# Patient Record
Sex: Female | Born: 1950
Health system: Southern US, Community
[De-identification: ages and names within clinical notes are randomized; demographics above are authoritative.]

## PROBLEM LIST (undated history)

## (undated) DIAGNOSIS — K759 Inflammatory liver disease, unspecified: Secondary | ICD-10-CM

## (undated) DIAGNOSIS — I10 Essential (primary) hypertension: Secondary | ICD-10-CM

## (undated) DIAGNOSIS — I2699 Other pulmonary embolism without acute cor pulmonale: Secondary | ICD-10-CM

## (undated) DIAGNOSIS — E7800 Pure hypercholesterolemia, unspecified: Secondary | ICD-10-CM

## (undated) DIAGNOSIS — M1990 Unspecified osteoarthritis, unspecified site: Secondary | ICD-10-CM

## (undated) HISTORY — PX: OTHER SURGICAL HISTORY: SHX169

## (undated) HISTORY — DX: Pure hypercholesterolemia, unspecified: E78.00

## (undated) HISTORY — DX: Essential (primary) hypertension: I10

## (undated) HISTORY — PX: TONSILLECTOMY: SUR1361

---

## 1991-07-15 HISTORY — PX: ABDOMINAL HYSTERECTOMY: SHX81

## 2006-04-15 ENCOUNTER — Ambulatory Visit: Payer: Self-pay | Admitting: Orthopedic Surgery

## 2009-08-21 ENCOUNTER — Encounter: Payer: Self-pay | Admitting: Cardiology

## 2009-08-22 ENCOUNTER — Encounter: Payer: Self-pay | Admitting: Cardiology

## 2009-08-23 ENCOUNTER — Encounter: Payer: Self-pay | Admitting: Cardiology

## 2009-08-28 ENCOUNTER — Encounter: Payer: Self-pay | Admitting: Cardiology

## 2009-09-17 ENCOUNTER — Encounter: Payer: Self-pay | Admitting: Cardiology

## 2009-09-18 ENCOUNTER — Encounter: Payer: Self-pay | Admitting: Cardiology

## 2009-10-08 DIAGNOSIS — I10 Essential (primary) hypertension: Secondary | ICD-10-CM | POA: Insufficient documentation

## 2009-10-08 DIAGNOSIS — R079 Chest pain, unspecified: Secondary | ICD-10-CM

## 2009-10-08 DIAGNOSIS — E7800 Pure hypercholesterolemia, unspecified: Secondary | ICD-10-CM

## 2009-10-08 DIAGNOSIS — R0602 Shortness of breath: Secondary | ICD-10-CM

## 2009-10-09 ENCOUNTER — Ambulatory Visit: Payer: Self-pay | Admitting: Cardiology

## 2009-10-09 DIAGNOSIS — E669 Obesity, unspecified: Secondary | ICD-10-CM | POA: Insufficient documentation

## 2009-10-09 DIAGNOSIS — R9439 Abnormal result of other cardiovascular function study: Secondary | ICD-10-CM | POA: Insufficient documentation

## 2009-12-17 ENCOUNTER — Ambulatory Visit (HOSPITAL_COMMUNITY): Admission: RE | Admit: 2009-12-17 | Discharge: 2009-12-17 | Payer: Self-pay | Admitting: Cardiology

## 2009-12-17 ENCOUNTER — Encounter: Payer: Self-pay | Admitting: Cardiology

## 2009-12-17 ENCOUNTER — Ambulatory Visit: Payer: Self-pay | Admitting: Internal Medicine

## 2009-12-28 ENCOUNTER — Telehealth: Payer: Self-pay | Admitting: Cardiology

## 2010-01-15 ENCOUNTER — Ambulatory Visit: Payer: Self-pay | Admitting: Cardiology

## 2010-08-13 NOTE — Medication Information (Signed)
Summary: MEDICATION LIST FROM [facility]  MEDICATION LIST FROM [facility]   Imported By: Claudette Laws 10/05/2009 08:16:03  _____________________________________________________________________  External Attachment:    Type:   Image     Comment:   External Document

## 2010-08-13 NOTE — Letter (Signed)
Summary: External Correspondence/ OFFICE VISIT [facility]  External Correspondence/ [facility]ICE VISIT [facility]   Imported By: Dorise Hiss 09/07/2009 08:12:11  _____________________________________________________________________  External Attachment:    Type:   Image     Comment:   External Document

## 2010-08-13 NOTE — Assessment & Plan Note (Signed)
Summary: DISCUSS RESULTS OF CPX PER DR. Wisam Siefring-JM   Visit Type:  Follow-up Primary Provider:  Dr. Kirstie Peri  CC:  Dyspnea and HTN.  History of Present Illness: The patient presents for followup of the above.  Since I last saw her she continues to have dyspnea with exertion. She had a cardiopulmonary stress test which demonstrated no significant circulatory or ventilatory limitation. However, she did have a hypertensive blood pressure response and some suggestion that diastolic dysfunction could be contributing to symptoms. She has had an echo with normal systolic function but some LVH. Since that time she has had no new symptoms. She is not describing resting shortness of breath, PND or orthopnea. She is not having any chest pressure, neck or arm discomfort. She is not having any palpitations, presyncope or syncope.  Preventive Screening-Counseling & Management  Alcohol-Tobacco     Smoking Status: never  Current Medications (verified): 1)  Simvastatin 20 Mg Tabs (Simvastatin) .... Take 1 Tablet By Mouth Once A Day 2)  Diovan Hct 160-25 Mg Tabs (Valsartan-Hydrochlorothiazide) .... Take 1 Tablet By Mouth Once A Day 3)  Aspir-Low 81 Mg Tbec (Aspirin) .... Take 1 Tablet By Mouth Once A Day 4)  Lunesta 3 Mg Tabs (Eszopiclone) .... Take 1 Tab By Mouth At Bedtime As Needed 5)  B-12 1500 Mcg Cr-Tabs (Cyanocobalamin) .... Take 1 Tablet By Mouth Once A Day  Allergies (verified): No Known Drug Allergies  Comments:  Nurse/Medical Assistant: The patient is currently on medications but does not know the name or dosage at this time. Instructed to contact our office with details. Will update medication list at that time.  Past History:  Past Medical History: Reviewed history from 10/09/2009 and no changes required. HYPERTENSION since 2000 PURE HYPERCHOLESTEROLEMIA since 2000 LONG-TERM USE OF ASPIRIN  SHORTNESS OF BREATH   Past Surgical History: Reviewed history from 10/09/2009 and no  changes required. Hysterectomy CHIN LIFT PLASTIC SURGERY 2005 DEXA SCAN 2004 SHOWED SCORE -0.6 Tonsilectomy  Review of Systems       As stated in the HPI and negative for all other systems.   Vital Signs:  Patient profile:   60 year old female Height:      66 inches Weight:      189 pounds Pulse rate:   78 / minute BP sitting:   135 / 72  (left arm) Cuff size:   large  Vitals Entered By: Carlye Grippe (January 15, 2010 10:55 AM)  Physical Exam  General:  Well developed, well nourished, in no acute distress. Head:  normocephalic and atraumatic Eyes:  PERRLA/EOM intact; conjunctiva and lids normal. Neck:  Neck supple, no JVD. No masses, thyromegaly or abnormal cervical nodes. Chest Wall:  no deformities or breast masses noted Lungs:  Clear bilaterally to auscultation and percussion.rales R base.  rales R base.   Abdomen:  Bowel sounds positive; abdomen soft and non-tender without masses, organomegaly, or hernias noted. No hepatosplenomegaly. Msk:  Back normal, normal gait. Muscle strength and tone normal. Extremities:  No clubbing or cyanosis. Neurologic:  Alert and oriented x 3. Skin:  Intact without lesions or rashes. Cervical Nodes:  no significant adenopathy Psych:  Normal affect.   Detailed Cardiovascular Exam  Neck    Carotids: Carotids full and equal bilaterally without bruits.      Neck Veins: Normal, no JVD.    Heart    Inspection: no deformities or lifts noted.      Palpation: normal PMI with no thrills palpable.  Auscultation: regular rate and rhythm, S1, S2 without murmurs, rubs, gallops, or clicks.    Vascular    Abdominal Aorta: no palpable masses, pulsations, or audible bruits.      Femoral Pulses: normal femoral pulses bilaterally.      Pedal Pulses: normal pedal pulses bilaterally.      Radial Pulses: normal radial pulses bilaterally.      Peripheral Circulation: no clubbing, cyanosis, or edema noted with normal capillary refill.      Impression & Recommendations:  Problem # 1:  SHORTNESS OF BREATH (ICD-786.05) It seems that her dyspnea is most likely related somewhat to deconditioning and weight but in addition to a hypertensive response with exercise and diastolic dysfunction. We have discussed this and a strategy of blood pressure control as described below.  Problem # 2:  HYPERTENSION (ICD-401.9) I am going to try for better blood pressure control with exercise which means a lower resting blood pressure. I will add amlodipine 2.5 mg daily to her regimen. 2 avoid drug interaction I will switch her from simvastatin to pravastatin.  Problem # 3:  OBESITY, UNSPECIFIED (ICD-278.00) We discussed at length a weight loss strategy to try to achieve blood pressure control and improvement in dyspnea.  Problem # 4:  PURE HYPERCHOLESTEROLEMIA (ICD-272.0) I will suggest a followup lipid profile in 8-10 weeks with the med change as above.  Patient Instructions: 1)  Your physician wants you to follow-up in: 4 months. You will receive a reminder letter in the mail one-two months in advance. If you don't receive a letter, please call our office to schedule the follow-up appointment.  2)  Stop Simvastatin. 3)  Start Pravastatin 40mg  by mouth at bedtime. 4)  Start Norvasc (amlodipine) 2.5mg  by mouth once daily. Prescriptions: AMLODIPINE BESYLATE 2.5 MG TABS (AMLODIPINE BESYLATE) Take one tablet by mouth daily  #30 x 6   Entered by:   Cyril Loosen, RN, BSN   Authorized by:   Rollene Rotunda, MD, [facility]   Signed by:   Cyril Loosen, RN, BSN on 01/15/2010   Method used:   Electronically to        [facility]* (retail)       [address]       [place], [place]  [address]       Ph: [phone]       Fax: [phone]   RxID:   [id number] PRAVASTATIN SODIUM 40 MG TABS (PRAVASTATIN SODIUM) Take one tablet by mouth daily at bedtime  #30 x 6   Entered by:   Cyril Loosen, RN, BSN   Authorized by:   Rollene Rotunda, MD, [facility]   Signed by:   Cyril Loosen, RN, BSN on 01/15/2010   Method used:   Electronically to        [facility]* (retail)       [address]       [place], [place]  [address]       Ph: [phone]       Fax: [phone]   RxID:   [id number]  I have reviewed and approved all prescriptions at the time of this visit. Rollene Rotunda, MD, [facility]  January 15, 2010 11:32 AM

## 2010-08-13 NOTE — Letter (Signed)
Summary: [facility]OFFICE [facility]  [facility]OFF[facility]   Imported By: Claudette Laws 10/05/2009 08:16:40  _____________________________________________________________________  External Attachment:    Type:   Image     Comment:   External Document

## 2010-08-13 NOTE — Progress Notes (Signed)
Summary: PHONE: CPX RESULTS AND SHORTNESS OF BREATH  Phone Note Call from Patient Call back at Home Phone [phone]   Caller: Patient Summary of Call: Continues to have shortness of breath . Patient would like to know her test results of CPX. please call [phone] (cell) Initial call taken by: Zachary George,  December 28, 2009 3:42 PM  Follow-up for Phone Call        Pt had CXP done on 6/6. Pt notified these results are not available yet. Pt also notified note will be sent to Dr. Antoine Poche for review. Follow-up by: Cyril Loosen, RN, BSN,  December 28, 2009 4:22 PM  Additional Follow-up for Phone Call Additional follow up Details #1::        Left message for Laymond Purser in CPX lab to call regarding results. Cyril Loosen, RN, BSN  December 31, 2009 11:00 AM     Additional Follow-up for Phone Call Additional follow up Details #2::    Patient is aware that this study result is not yet available. Follow-up by: Rollene Rotunda, MD, [facility],  December 31, 2009 3:12 PM

## 2010-08-13 NOTE — Letter (Signed)
Summary: [facility] D/C DR. Beatrix Fetters [facility]  [facility] D/C DR. Kirstie Peri   Imported By: Zachary George 10/08/2009 18:11:45  _____________________________________________________________________  External Attachment:    Type:   Image     Comment:   External Document

## 2010-08-13 NOTE — Assessment & Plan Note (Signed)
Summary: NP-ABN ECHO   Visit Type:  Initial Consult Primary Provider:  Dr. Kirstie Peri  CC:  DOE.  History of Present Illness: The patient presents for evaluation of shortness of breath. She has had no prior cardiac history very however, she has had shortness of breath which she thought started rather abruptly around Christmas. She noted that activities such as walking her granddaughter on level ground a short to moderate distance would cause shortness of breath. She found it hard to walk a half a block. She would be panting for breath and would need to stop and go home. She would recover in a few minutes. She did describe to Dr. Sherryll Burger some chest tightness and was actually hospitalized in February. Echocardiogram demonstrated an EF of 65% with some mild LVH and questionable diastolic dysfunction. Echocardiography suggested possibly some very mild apical ischemia versus attenuation. The patient denies any resting symptoms and has no PND or orthopnea. She does have some feeling that her heart is going fast when she exerts herself. She's not having any resting palpitations, presyncope or syncope. She doesn't have any neck or arm discomfort. She has no associated nausea vomiting or diaphoresis.  Preventive Screening-Counseling & Management  Alcohol-Tobacco     Smoking Status: never  Current Medications (verified): 1)  Simvastatin 20 Mg Tabs (Simvastatin) .... Take 1 Tablet By Mouth Once A Day 2)  Diovan Hct 160-25 Mg Tabs (Valsartan-Hydrochlorothiazide) .... Take 1 Tablet By Mouth Once A Day 3)  Aspir-Low 81 Mg Tbec (Aspirin) .... Take 1 Tablet By Mouth Once A Day 4)  Lunesta 3 Mg Tabs (Eszopiclone) .... Take 1 Tab By Mouth At Bedtime As Needed 5)  B-12 1500 Mcg Cr-Tabs (Cyanocobalamin) .... Take 1 Tablet By Mouth Once A Day  Allergies (verified): No Known Drug Allergies  Comments:  Nurse/Medical Assistant: The patient's medications were reviewed with the patient and were updated in the  Medication List. Pt brought a list of medications to office visit.  Cyril Loosen, RN, BSN (October 09, 2009 10:10 AM)  Past History:  Past Medical History: HYPERTENSION since 2000 PURE HYPERCHOLESTEROLEMIA since 2000 LONG-TERM USE OF ASPIRIN  SHORTNESS OF BREATH   Past Surgical History: Hysterectomy CHIN LIFT PLASTIC SURGERY 2005 DEXA SCAN 2004 SHOWED SCORE -0.6 Tonsilectomy  Family History: MOTHER HAD BREAST CANCER FATHER DIED AGE 16 HAD CAD (CABG late 42s) FAMILY HISTORY OF RHEUMATOID ARTHRITIS  Social History: Alcohol Use - no Drug Use - no Tobacco never Married, 2 childrenSmoking Status:  never  Review of Systems       Difficulty sleeping. Otherwise as stated in the history of present illness negative for other systems.  Vital Signs:  Patient profile:   60 year old female Height:      66 inches Weight:      186.50 pounds BMI:     30.21 Pulse rate:   73 / minute BP sitting:   141 / 78  (left arm) Cuff size:   regular  Vitals Entered By: Cyril Loosen, RN, BSN (October 09, 2009 10:05 AM)  Nutrition Counseling: Patient's BMI is greater than 25 and therefore counseled on weight management options. CC: DOE   Physical Exam  General:  Well developed, well nourished, in no acute distress. Head:  normocephalic and atraumatic Eyes:  PERRLA/EOM intact; conjunctiva and lids normal. Mouth:  Teeth, gums and palate normal. Oral mucosa normal. Neck:  Neck supple, no JVD. No masses, thyromegaly or abnormal cervical nodes. Chest Wall:  no deformities or breast masses noted  Lungs:  Clear bilaterally to auscultation and percussion. Abdomen:  Bowel sounds positive; abdomen soft and non-tender without masses, organomegaly, or hernias noted. No hepatosplenomegaly. Msk:  Back normal, normal gait. Muscle strength and tone normal. Extremities:  No clubbing or cyanosis. Neurologic:  Alert and oriented x 3. Skin:  Intact without lesions or rashes. Cervical Nodes:  no significant  adenopathy Axillary Nodes:  no significant adenopathy Inguinal Nodes:  no significant adenopathy Psych:  Normal affect.   Detailed Cardiovascular Exam  Neck    Carotids: Carotids full and equal bilaterally without bruits.      Neck Veins: Normal, no JVD.    Heart    Inspection: no deformities or lifts noted.      Palpation: normal PMI with no thrills palpable.      Auscultation: regular rate and rhythm, S1, S2 without murmurs, rubs, gallops, or clicks.    Vascular    Abdominal Aorta: no palpable masses, pulsations, or audible bruits.      Femoral Pulses: normal femoral pulses bilaterally.      Pedal Pulses: normal pedal pulses bilaterally.      Radial Pulses: normal radial pulses bilaterally.      Peripheral Circulation: no clubbing, cyanosis, or edema noted with normal capillary refill.     EKG  Procedure date:  08/22/2009  Findings:      Normal sinus rhythm, rate 93, early transition, axis within normal limits, intervals within normal limits, no acute ST-T wave changes.  Impression & Recommendations:  Problem # 1:  SHORTNESS OF BREATH (ICD-786.05)  The patient has dyspnea on exertion which is a relatively sudden onset. I do not think the stress perfusion study indicates obstructive coronary disease in that her physical and history is not consistent with that. She has mild risk factors. Rather this could be a pulmonary etiology. She may have some diastolic dysfunction but again I would think this is minor. I have discussed with her that in this situation I think cardiopulmonary stress testing is indicated. I will order this.  Her updated medication list for this problem includes:    Diovan Hct 160-25 Mg Tabs (Valsartan-hydrochlorothiazide) .Marland Kitchen... Take 1 tablet by mouth once a day    Aspir-low 81 Mg Tbec (Aspirin) .Marland Kitchen... Take 1 tablet by mouth once a day  Orders: CPX Test at [facility] (CPX Test)  Problem # 2:  HYPERTENSION (ICD-401.9)  Her blood pressure is  upper limits of normal. I would suggest weight loss as the management strategy for this.  Her updated medication list for this problem includes:    Diovan Hct 160-25 Mg Tabs (Valsartan-hydrochlorothiazide) .Marland Kitchen... Take 1 tablet by mouth once a day    Aspir-low 81 Mg Tbec (Aspirin) .Marland Kitchen... Take 1 tablet by mouth once a day  Problem # 3:  OBESITY, UNSPECIFIED (ICD-278.00) Her BMI puts her in the obese range. We talked about weight loss.  Problem # 4:  MYOCARDIAL PERFUSION SCAN, WITH STRESS TEST, ABNORMAL (ICD-794.39) As above I think this represents a low risk study with probable artifact. She needs continued primary risk reduction.  Patient Instructions: 1)  Your physician has recommended that you have a cardiopulmonary stress test (CPX).  CPX testing is a non-invasive measurement of heart and lung function. It replaces a traditional treadmill stress test. This type of test provides a tremendous amount of information that relates not only to your present condition but also for future outcomes.  This test combines measurements of your ventilation, respiratory gas exchange in the lungs,  electrocardiogram (EKG), blood pressure and physical response before, during, and following an exercise protocol. 2)  Your physician recommends that you continue on your current medications as directed. Please refer to the Current Medication list given to you today. 3)  Follow up pending.

## 2010-08-13 NOTE — Letter (Signed)
Summary: Internal Other/ PATIENT HISTORY FORM  Internal Other/ PATIENT HISTORY FORM   Imported By: Dorise Hiss 10/09/2009 11:31:50  _____________________________________________________________________  External Attachment:    Type:   Image     Comment:   External Document

## 2012-01-29 ENCOUNTER — Encounter: Payer: Self-pay | Admitting: *Deleted

## 2013-03-14 DIAGNOSIS — I2699 Other pulmonary embolism without acute cor pulmonale: Secondary | ICD-10-CM

## 2013-03-14 HISTORY — DX: Other pulmonary embolism without acute cor pulmonale: I26.99

## 2013-03-14 HISTORY — PX: JOINT REPLACEMENT: SHX530

## 2015-08-08 DIAGNOSIS — Z85828 Personal history of other malignant neoplasm of skin: Secondary | ICD-10-CM | POA: Diagnosis not present

## 2015-08-08 DIAGNOSIS — C44519 Basal cell carcinoma of skin of other part of trunk: Secondary | ICD-10-CM | POA: Diagnosis not present

## 2015-08-08 DIAGNOSIS — L905 Scar conditions and fibrosis of skin: Secondary | ICD-10-CM | POA: Diagnosis not present

## 2015-08-08 DIAGNOSIS — D485 Neoplasm of uncertain behavior of skin: Secondary | ICD-10-CM | POA: Diagnosis not present

## 2015-08-08 DIAGNOSIS — L570 Actinic keratosis: Secondary | ICD-10-CM | POA: Diagnosis not present

## 2015-08-23 DIAGNOSIS — L089 Local infection of the skin and subcutaneous tissue, unspecified: Secondary | ICD-10-CM | POA: Diagnosis not present

## 2015-08-23 DIAGNOSIS — C44519 Basal cell carcinoma of skin of other part of trunk: Secondary | ICD-10-CM | POA: Diagnosis not present

## 2015-08-27 DIAGNOSIS — I10 Essential (primary) hypertension: Secondary | ICD-10-CM | POA: Diagnosis not present

## 2015-08-27 DIAGNOSIS — I2699 Other pulmonary embolism without acute cor pulmonale: Secondary | ICD-10-CM | POA: Diagnosis not present

## 2015-08-27 DIAGNOSIS — Z789 Other specified health status: Secondary | ICD-10-CM | POA: Diagnosis not present

## 2015-08-27 DIAGNOSIS — G4700 Insomnia, unspecified: Secondary | ICD-10-CM | POA: Diagnosis not present

## 2015-08-27 DIAGNOSIS — E785 Hyperlipidemia, unspecified: Secondary | ICD-10-CM | POA: Diagnosis not present

## 2015-08-28 DIAGNOSIS — Z23 Encounter for immunization: Secondary | ICD-10-CM | POA: Diagnosis not present

## 2015-09-06 DIAGNOSIS — Z4802 Encounter for removal of sutures: Secondary | ICD-10-CM | POA: Diagnosis not present

## 2015-11-29 DIAGNOSIS — M2550 Pain in unspecified joint: Secondary | ICD-10-CM | POA: Diagnosis not present

## 2015-11-29 DIAGNOSIS — Z6830 Body mass index (BMI) 30.0-30.9, adult: Secondary | ICD-10-CM | POA: Diagnosis not present

## 2015-11-29 DIAGNOSIS — I2699 Other pulmonary embolism without acute cor pulmonale: Secondary | ICD-10-CM | POA: Diagnosis not present

## 2015-11-29 DIAGNOSIS — I10 Essential (primary) hypertension: Secondary | ICD-10-CM | POA: Diagnosis not present

## 2016-02-28 DIAGNOSIS — I2699 Other pulmonary embolism without acute cor pulmonale: Secondary | ICD-10-CM | POA: Diagnosis not present

## 2016-02-28 DIAGNOSIS — M549 Dorsalgia, unspecified: Secondary | ICD-10-CM | POA: Diagnosis not present

## 2016-02-28 DIAGNOSIS — I10 Essential (primary) hypertension: Secondary | ICD-10-CM | POA: Diagnosis not present

## 2016-05-23 DIAGNOSIS — G4700 Insomnia, unspecified: Secondary | ICD-10-CM | POA: Diagnosis not present

## 2016-05-23 DIAGNOSIS — E785 Hyperlipidemia, unspecified: Secondary | ICD-10-CM | POA: Diagnosis not present

## 2016-05-23 DIAGNOSIS — Z299 Encounter for prophylactic measures, unspecified: Secondary | ICD-10-CM | POA: Diagnosis not present

## 2016-05-23 DIAGNOSIS — Z6830 Body mass index (BMI) 30.0-30.9, adult: Secondary | ICD-10-CM | POA: Diagnosis not present

## 2016-05-23 DIAGNOSIS — I10 Essential (primary) hypertension: Secondary | ICD-10-CM | POA: Diagnosis not present

## 2016-05-26 DIAGNOSIS — M25551 Pain in right hip: Secondary | ICD-10-CM | POA: Diagnosis not present

## 2016-05-26 DIAGNOSIS — Z96641 Presence of right artificial hip joint: Secondary | ICD-10-CM | POA: Diagnosis not present

## 2016-05-27 DIAGNOSIS — Z1231 Encounter for screening mammogram for malignant neoplasm of breast: Secondary | ICD-10-CM | POA: Diagnosis not present

## 2016-05-29 DIAGNOSIS — Z96641 Presence of right artificial hip joint: Secondary | ICD-10-CM | POA: Diagnosis not present

## 2016-05-29 DIAGNOSIS — M25551 Pain in right hip: Secondary | ICD-10-CM | POA: Diagnosis not present

## 2016-05-29 DIAGNOSIS — M1612 Unilateral primary osteoarthritis, left hip: Secondary | ICD-10-CM | POA: Diagnosis not present

## 2016-08-01 DIAGNOSIS — Z96641 Presence of right artificial hip joint: Secondary | ICD-10-CM | POA: Diagnosis not present

## 2016-08-01 DIAGNOSIS — Z471 Aftercare following joint replacement surgery: Secondary | ICD-10-CM | POA: Diagnosis not present

## 2016-08-07 DIAGNOSIS — Z96641 Presence of right artificial hip joint: Secondary | ICD-10-CM | POA: Diagnosis not present

## 2016-08-14 DIAGNOSIS — Z471 Aftercare following joint replacement surgery: Secondary | ICD-10-CM | POA: Diagnosis not present

## 2016-08-14 DIAGNOSIS — M25551 Pain in right hip: Secondary | ICD-10-CM | POA: Diagnosis not present

## 2016-08-14 DIAGNOSIS — Z96641 Presence of right artificial hip joint: Secondary | ICD-10-CM | POA: Diagnosis not present

## 2016-08-23 DIAGNOSIS — Z23 Encounter for immunization: Secondary | ICD-10-CM | POA: Diagnosis not present

## 2016-08-25 DIAGNOSIS — Z789 Other specified health status: Secondary | ICD-10-CM | POA: Diagnosis not present

## 2016-08-25 DIAGNOSIS — E785 Hyperlipidemia, unspecified: Secondary | ICD-10-CM | POA: Diagnosis not present

## 2016-08-25 DIAGNOSIS — G4700 Insomnia, unspecified: Secondary | ICD-10-CM | POA: Diagnosis not present

## 2016-08-25 DIAGNOSIS — Z299 Encounter for prophylactic measures, unspecified: Secondary | ICD-10-CM | POA: Diagnosis not present

## 2016-08-25 DIAGNOSIS — I2699 Other pulmonary embolism without acute cor pulmonale: Secondary | ICD-10-CM | POA: Diagnosis not present

## 2016-08-25 DIAGNOSIS — M2550 Pain in unspecified joint: Secondary | ICD-10-CM | POA: Diagnosis not present

## 2016-08-25 DIAGNOSIS — I10 Essential (primary) hypertension: Secondary | ICD-10-CM | POA: Diagnosis not present

## 2016-08-25 DIAGNOSIS — Z6831 Body mass index (BMI) 31.0-31.9, adult: Secondary | ICD-10-CM | POA: Diagnosis not present

## 2016-08-27 DIAGNOSIS — M9903 Segmental and somatic dysfunction of lumbar region: Secondary | ICD-10-CM | POA: Diagnosis not present

## 2016-08-27 DIAGNOSIS — M47816 Spondylosis without myelopathy or radiculopathy, lumbar region: Secondary | ICD-10-CM | POA: Diagnosis not present

## 2016-08-27 DIAGNOSIS — S338XXA Sprain of other parts of lumbar spine and pelvis, initial encounter: Secondary | ICD-10-CM | POA: Diagnosis not present

## 2016-08-28 DIAGNOSIS — E785 Hyperlipidemia, unspecified: Secondary | ICD-10-CM | POA: Diagnosis not present

## 2016-08-29 DIAGNOSIS — M47816 Spondylosis without myelopathy or radiculopathy, lumbar region: Secondary | ICD-10-CM | POA: Diagnosis not present

## 2016-08-29 DIAGNOSIS — M9903 Segmental and somatic dysfunction of lumbar region: Secondary | ICD-10-CM | POA: Diagnosis not present

## 2016-08-29 DIAGNOSIS — S338XXA Sprain of other parts of lumbar spine and pelvis, initial encounter: Secondary | ICD-10-CM | POA: Diagnosis not present

## 2016-09-01 DIAGNOSIS — M47816 Spondylosis without myelopathy or radiculopathy, lumbar region: Secondary | ICD-10-CM | POA: Diagnosis not present

## 2016-09-01 DIAGNOSIS — M9903 Segmental and somatic dysfunction of lumbar region: Secondary | ICD-10-CM | POA: Diagnosis not present

## 2016-09-01 DIAGNOSIS — S338XXA Sprain of other parts of lumbar spine and pelvis, initial encounter: Secondary | ICD-10-CM | POA: Diagnosis not present

## 2016-09-03 DIAGNOSIS — S338XXA Sprain of other parts of lumbar spine and pelvis, initial encounter: Secondary | ICD-10-CM | POA: Diagnosis not present

## 2016-09-03 DIAGNOSIS — M47816 Spondylosis without myelopathy or radiculopathy, lumbar region: Secondary | ICD-10-CM | POA: Diagnosis not present

## 2016-09-03 DIAGNOSIS — M9903 Segmental and somatic dysfunction of lumbar region: Secondary | ICD-10-CM | POA: Diagnosis not present

## 2016-09-05 DIAGNOSIS — M9903 Segmental and somatic dysfunction of lumbar region: Secondary | ICD-10-CM | POA: Diagnosis not present

## 2016-09-05 DIAGNOSIS — M47816 Spondylosis without myelopathy or radiculopathy, lumbar region: Secondary | ICD-10-CM | POA: Diagnosis not present

## 2016-09-05 DIAGNOSIS — S338XXA Sprain of other parts of lumbar spine and pelvis, initial encounter: Secondary | ICD-10-CM | POA: Diagnosis not present

## 2016-09-15 DIAGNOSIS — M9903 Segmental and somatic dysfunction of lumbar region: Secondary | ICD-10-CM | POA: Diagnosis not present

## 2016-09-15 DIAGNOSIS — M47816 Spondylosis without myelopathy or radiculopathy, lumbar region: Secondary | ICD-10-CM | POA: Diagnosis not present

## 2016-09-15 DIAGNOSIS — S338XXA Sprain of other parts of lumbar spine and pelvis, initial encounter: Secondary | ICD-10-CM | POA: Diagnosis not present

## 2016-09-19 DIAGNOSIS — M9903 Segmental and somatic dysfunction of lumbar region: Secondary | ICD-10-CM | POA: Diagnosis not present

## 2016-09-19 DIAGNOSIS — S338XXA Sprain of other parts of lumbar spine and pelvis, initial encounter: Secondary | ICD-10-CM | POA: Diagnosis not present

## 2016-09-19 DIAGNOSIS — M47816 Spondylosis without myelopathy or radiculopathy, lumbar region: Secondary | ICD-10-CM | POA: Diagnosis not present

## 2016-11-18 DIAGNOSIS — I10 Essential (primary) hypertension: Secondary | ICD-10-CM | POA: Diagnosis not present

## 2016-11-18 DIAGNOSIS — Z6829 Body mass index (BMI) 29.0-29.9, adult: Secondary | ICD-10-CM | POA: Diagnosis not present

## 2016-11-18 DIAGNOSIS — Z299 Encounter for prophylactic measures, unspecified: Secondary | ICD-10-CM | POA: Diagnosis not present

## 2016-11-18 DIAGNOSIS — G4700 Insomnia, unspecified: Secondary | ICD-10-CM | POA: Diagnosis not present

## 2016-11-18 DIAGNOSIS — Z713 Dietary counseling and surveillance: Secondary | ICD-10-CM | POA: Diagnosis not present

## 2016-11-26 DIAGNOSIS — Z85828 Personal history of other malignant neoplasm of skin: Secondary | ICD-10-CM | POA: Diagnosis not present

## 2016-11-26 DIAGNOSIS — L570 Actinic keratosis: Secondary | ICD-10-CM | POA: Diagnosis not present

## 2016-11-26 DIAGNOSIS — C44519 Basal cell carcinoma of skin of other part of trunk: Secondary | ICD-10-CM | POA: Diagnosis not present

## 2016-11-26 DIAGNOSIS — D485 Neoplasm of uncertain behavior of skin: Secondary | ICD-10-CM | POA: Diagnosis not present

## 2016-12-03 DIAGNOSIS — H2513 Age-related nuclear cataract, bilateral: Secondary | ICD-10-CM | POA: Diagnosis not present

## 2016-12-03 DIAGNOSIS — H5203 Hypermetropia, bilateral: Secondary | ICD-10-CM | POA: Diagnosis not present

## 2016-12-03 DIAGNOSIS — H524 Presbyopia: Secondary | ICD-10-CM | POA: Diagnosis not present

## 2016-12-03 DIAGNOSIS — H52223 Regular astigmatism, bilateral: Secondary | ICD-10-CM | POA: Diagnosis not present

## 2016-12-18 DIAGNOSIS — L988 Other specified disorders of the skin and subcutaneous tissue: Secondary | ICD-10-CM | POA: Diagnosis not present

## 2016-12-18 DIAGNOSIS — C44519 Basal cell carcinoma of skin of other part of trunk: Secondary | ICD-10-CM | POA: Diagnosis not present

## 2017-01-01 DIAGNOSIS — Z4802 Encounter for removal of sutures: Secondary | ICD-10-CM | POA: Diagnosis not present

## 2017-01-28 DIAGNOSIS — G4700 Insomnia, unspecified: Secondary | ICD-10-CM | POA: Diagnosis not present

## 2017-01-28 DIAGNOSIS — I10 Essential (primary) hypertension: Secondary | ICD-10-CM | POA: Diagnosis not present

## 2017-01-28 DIAGNOSIS — H109 Unspecified conjunctivitis: Secondary | ICD-10-CM | POA: Diagnosis not present

## 2017-01-28 DIAGNOSIS — Z299 Encounter for prophylactic measures, unspecified: Secondary | ICD-10-CM | POA: Diagnosis not present

## 2017-01-28 DIAGNOSIS — Z6828 Body mass index (BMI) 28.0-28.9, adult: Secondary | ICD-10-CM | POA: Diagnosis not present

## 2017-05-18 DIAGNOSIS — Z6829 Body mass index (BMI) 29.0-29.9, adult: Secondary | ICD-10-CM | POA: Diagnosis not present

## 2017-05-18 DIAGNOSIS — Z299 Encounter for prophylactic measures, unspecified: Secondary | ICD-10-CM | POA: Diagnosis not present

## 2017-05-18 DIAGNOSIS — M25552 Pain in left hip: Secondary | ICD-10-CM | POA: Diagnosis not present

## 2017-05-18 DIAGNOSIS — G4700 Insomnia, unspecified: Secondary | ICD-10-CM | POA: Diagnosis not present

## 2017-05-18 DIAGNOSIS — I10 Essential (primary) hypertension: Secondary | ICD-10-CM | POA: Diagnosis not present

## 2017-06-02 DIAGNOSIS — M7062 Trochanteric bursitis, left hip: Secondary | ICD-10-CM | POA: Diagnosis not present

## 2017-06-02 DIAGNOSIS — M1612 Unilateral primary osteoarthritis, left hip: Secondary | ICD-10-CM | POA: Diagnosis not present

## 2017-06-02 DIAGNOSIS — M25552 Pain in left hip: Secondary | ICD-10-CM | POA: Diagnosis not present

## 2017-06-02 DIAGNOSIS — Z96641 Presence of right artificial hip joint: Secondary | ICD-10-CM | POA: Diagnosis not present

## 2017-07-14 DIAGNOSIS — Z87442 Personal history of urinary calculi: Secondary | ICD-10-CM

## 2017-07-14 HISTORY — DX: Personal history of urinary calculi: Z87.442

## 2017-07-15 DIAGNOSIS — M25552 Pain in left hip: Secondary | ICD-10-CM | POA: Diagnosis not present

## 2017-07-15 DIAGNOSIS — M7062 Trochanteric bursitis, left hip: Secondary | ICD-10-CM | POA: Diagnosis not present

## 2017-07-15 DIAGNOSIS — Z96641 Presence of right artificial hip joint: Secondary | ICD-10-CM | POA: Diagnosis not present

## 2017-07-15 DIAGNOSIS — M1612 Unilateral primary osteoarthritis, left hip: Secondary | ICD-10-CM | POA: Diagnosis not present

## 2017-07-15 DIAGNOSIS — M5410 Radiculopathy, site unspecified: Secondary | ICD-10-CM | POA: Diagnosis not present

## 2017-07-16 DIAGNOSIS — M1612 Unilateral primary osteoarthritis, left hip: Secondary | ICD-10-CM | POA: Diagnosis not present

## 2017-07-17 DIAGNOSIS — M1612 Unilateral primary osteoarthritis, left hip: Secondary | ICD-10-CM | POA: Diagnosis not present

## 2017-07-17 DIAGNOSIS — M25552 Pain in left hip: Secondary | ICD-10-CM | POA: Diagnosis not present

## 2017-08-13 DIAGNOSIS — M25552 Pain in left hip: Secondary | ICD-10-CM | POA: Diagnosis not present

## 2017-08-13 DIAGNOSIS — Z96641 Presence of right artificial hip joint: Secondary | ICD-10-CM | POA: Diagnosis not present

## 2017-08-13 DIAGNOSIS — M7062 Trochanteric bursitis, left hip: Secondary | ICD-10-CM | POA: Diagnosis not present

## 2017-08-13 DIAGNOSIS — M1612 Unilateral primary osteoarthritis, left hip: Secondary | ICD-10-CM | POA: Diagnosis not present

## 2017-08-18 DIAGNOSIS — I10 Essential (primary) hypertension: Secondary | ICD-10-CM | POA: Diagnosis not present

## 2017-08-18 DIAGNOSIS — Z299 Encounter for prophylactic measures, unspecified: Secondary | ICD-10-CM | POA: Diagnosis not present

## 2017-08-18 DIAGNOSIS — G4700 Insomnia, unspecified: Secondary | ICD-10-CM | POA: Diagnosis not present

## 2017-08-18 DIAGNOSIS — E785 Hyperlipidemia, unspecified: Secondary | ICD-10-CM | POA: Diagnosis not present

## 2017-08-18 DIAGNOSIS — I2699 Other pulmonary embolism without acute cor pulmonale: Secondary | ICD-10-CM | POA: Diagnosis not present

## 2017-08-18 DIAGNOSIS — Z6830 Body mass index (BMI) 30.0-30.9, adult: Secondary | ICD-10-CM | POA: Diagnosis not present

## 2017-09-03 DIAGNOSIS — H16223 Keratoconjunctivitis sicca, not specified as Sjogren's, bilateral: Secondary | ICD-10-CM | POA: Diagnosis not present

## 2017-09-03 DIAGNOSIS — H04123 Dry eye syndrome of bilateral lacrimal glands: Secondary | ICD-10-CM | POA: Diagnosis not present

## 2017-09-03 DIAGNOSIS — H04203 Unspecified epiphora, bilateral lacrimal glands: Secondary | ICD-10-CM | POA: Diagnosis not present

## 2017-09-17 DIAGNOSIS — M1611 Unilateral primary osteoarthritis, right hip: Secondary | ICD-10-CM | POA: Diagnosis not present

## 2017-09-17 DIAGNOSIS — Z96641 Presence of right artificial hip joint: Secondary | ICD-10-CM | POA: Diagnosis not present

## 2017-09-17 DIAGNOSIS — M160 Bilateral primary osteoarthritis of hip: Secondary | ICD-10-CM | POA: Diagnosis not present

## 2017-09-17 DIAGNOSIS — M1612 Unilateral primary osteoarthritis, left hip: Secondary | ICD-10-CM | POA: Diagnosis not present

## 2017-09-24 DIAGNOSIS — M47816 Spondylosis without myelopathy or radiculopathy, lumbar region: Secondary | ICD-10-CM | POA: Diagnosis not present

## 2017-09-24 DIAGNOSIS — S338XXA Sprain of other parts of lumbar spine and pelvis, initial encounter: Secondary | ICD-10-CM | POA: Diagnosis not present

## 2017-09-24 DIAGNOSIS — M9903 Segmental and somatic dysfunction of lumbar region: Secondary | ICD-10-CM | POA: Diagnosis not present

## 2017-09-25 DIAGNOSIS — H04203 Unspecified epiphora, bilateral lacrimal glands: Secondary | ICD-10-CM | POA: Diagnosis not present

## 2017-09-25 DIAGNOSIS — M9903 Segmental and somatic dysfunction of lumbar region: Secondary | ICD-10-CM | POA: Diagnosis not present

## 2017-09-25 DIAGNOSIS — M47816 Spondylosis without myelopathy or radiculopathy, lumbar region: Secondary | ICD-10-CM | POA: Diagnosis not present

## 2017-09-25 DIAGNOSIS — S338XXA Sprain of other parts of lumbar spine and pelvis, initial encounter: Secondary | ICD-10-CM | POA: Diagnosis not present

## 2017-09-25 DIAGNOSIS — H04123 Dry eye syndrome of bilateral lacrimal glands: Secondary | ICD-10-CM | POA: Diagnosis not present

## 2017-09-25 DIAGNOSIS — H16223 Keratoconjunctivitis sicca, not specified as Sjogren's, bilateral: Secondary | ICD-10-CM | POA: Diagnosis not present

## 2017-09-28 DIAGNOSIS — S338XXA Sprain of other parts of lumbar spine and pelvis, initial encounter: Secondary | ICD-10-CM | POA: Diagnosis not present

## 2017-09-28 DIAGNOSIS — M47816 Spondylosis without myelopathy or radiculopathy, lumbar region: Secondary | ICD-10-CM | POA: Diagnosis not present

## 2017-09-28 DIAGNOSIS — M9903 Segmental and somatic dysfunction of lumbar region: Secondary | ICD-10-CM | POA: Diagnosis not present

## 2017-09-30 DIAGNOSIS — J069 Acute upper respiratory infection, unspecified: Secondary | ICD-10-CM | POA: Diagnosis not present

## 2017-09-30 DIAGNOSIS — Z789 Other specified health status: Secondary | ICD-10-CM | POA: Diagnosis not present

## 2017-09-30 DIAGNOSIS — Z6830 Body mass index (BMI) 30.0-30.9, adult: Secondary | ICD-10-CM | POA: Diagnosis not present

## 2017-09-30 DIAGNOSIS — Z299 Encounter for prophylactic measures, unspecified: Secondary | ICD-10-CM | POA: Diagnosis not present

## 2017-09-30 DIAGNOSIS — I10 Essential (primary) hypertension: Secondary | ICD-10-CM | POA: Diagnosis not present

## 2017-09-30 DIAGNOSIS — Z2821 Immunization not carried out because of patient refusal: Secondary | ICD-10-CM | POA: Diagnosis not present

## 2017-10-05 DIAGNOSIS — M47816 Spondylosis without myelopathy or radiculopathy, lumbar region: Secondary | ICD-10-CM | POA: Diagnosis not present

## 2017-10-05 DIAGNOSIS — S338XXA Sprain of other parts of lumbar spine and pelvis, initial encounter: Secondary | ICD-10-CM | POA: Diagnosis not present

## 2017-10-05 DIAGNOSIS — M9903 Segmental and somatic dysfunction of lumbar region: Secondary | ICD-10-CM | POA: Diagnosis not present

## 2017-10-12 DIAGNOSIS — M47816 Spondylosis without myelopathy or radiculopathy, lumbar region: Secondary | ICD-10-CM | POA: Diagnosis not present

## 2017-10-12 DIAGNOSIS — M9903 Segmental and somatic dysfunction of lumbar region: Secondary | ICD-10-CM | POA: Diagnosis not present

## 2017-10-12 DIAGNOSIS — S338XXA Sprain of other parts of lumbar spine and pelvis, initial encounter: Secondary | ICD-10-CM | POA: Diagnosis not present

## 2017-10-15 DIAGNOSIS — M9903 Segmental and somatic dysfunction of lumbar region: Secondary | ICD-10-CM | POA: Diagnosis not present

## 2017-10-15 DIAGNOSIS — S338XXA Sprain of other parts of lumbar spine and pelvis, initial encounter: Secondary | ICD-10-CM | POA: Diagnosis not present

## 2017-10-15 DIAGNOSIS — M47816 Spondylosis without myelopathy or radiculopathy, lumbar region: Secondary | ICD-10-CM | POA: Diagnosis not present

## 2017-11-04 NOTE — Progress Notes (Signed)
Need orders in epic for 5-15 surgery

## 2017-11-12 DIAGNOSIS — M1710 Unilateral primary osteoarthritis, unspecified knee: Secondary | ICD-10-CM | POA: Diagnosis not present

## 2017-11-12 DIAGNOSIS — Z6830 Body mass index (BMI) 30.0-30.9, adult: Secondary | ICD-10-CM | POA: Diagnosis not present

## 2017-11-12 DIAGNOSIS — I10 Essential (primary) hypertension: Secondary | ICD-10-CM | POA: Diagnosis not present

## 2017-11-12 DIAGNOSIS — Z299 Encounter for prophylactic measures, unspecified: Secondary | ICD-10-CM | POA: Diagnosis not present

## 2017-11-12 DIAGNOSIS — Z713 Dietary counseling and surveillance: Secondary | ICD-10-CM | POA: Diagnosis not present

## 2017-11-19 ENCOUNTER — Other Ambulatory Visit (HOSPITAL_COMMUNITY): Payer: Self-pay | Admitting: *Deleted

## 2017-11-19 NOTE — Patient Instructions (Addendum)
Christina Clay  11/19/2017   Your procedure is scheduled on: 11-25-17  Report to [facility] Main  Entrance  Report to admitting at 1000 AM    Call this number if you have problems the morning of surgery [phone]   Remember: Do not eat food or drink liquids :After Midnight.     Take these medicines the morning of surgery with A SIP OF WATER: PRAVASTATIN (PRAVACHOL), AMLODIPINE (NORVASC)               You may not have any metal on your body including hair pins and              piercings  Do not wear jewelry, make-up, lotions, powders or perfumes, deodorant             Do not wear nail polish.  Do not shave  48 hours prior to surgery.                 Do not bring valuables to the hospital. [place].  Contacts, dentures or bridgework may not be worn into surgery.  Leave suitcase in the car. After surgery it may be brought to your room.                   Please read over the following fact sheets you were given: _____________________________________________________________________             [facility] - Preparing for Surgery Before surgery, you can play an important role.  Because skin is not sterile, your skin needs to be as free of germs as possible.  You can reduce the number of germs on your skin by washing with CHG (chlorahexidine gluconate) soap before surgery.  CHG is an antiseptic cleaner which kills germs and bonds with the skin to continue killing germs even after washing. Please DO NOT use if you have an allergy to CHG or antibacterial soaps.  If your skin becomes reddened/irritated stop using the CHG and inform your nurse when you arrive at Short Stay. Do not shave (including legs and underarms) for at least 48 hours prior to the first CHG shower.  You may shave your face/neck. Please follow these instructions carefully:  1.  Shower with CHG Soap the night before surgery and the  morning of  Surgery.  2.  If you choose to wash your hair, wash your hair first as usual with your  normal  shampoo.  3.  After you shampoo, rinse your hair and body thoroughly to remove the  shampoo.                           4.  Use CHG as you would any other liquid soap.  You can apply chg directly  to the skin and wash                       Gently with a scrungie or clean washcloth.  5.  Apply the CHG Soap to your body ONLY FROM THE NECK DOWN.   Do not use on face/ open                           Wound  or open sores. Avoid contact with eyes, ears mouth and genitals (private parts).                       Wash face,  Genitals (private parts) with your normal soap.             6.  Wash thoroughly, paying special attention to the area where your surgery  will be performed.  7.  Thoroughly rinse your body with warm water from the neck down.  8.  DO NOT shower/wash with your normal soap after using and rinsing off  the CHG Soap.                9.  Pat yourself dry with a clean towel.            10.  Wear clean pajamas.            11.  Place clean sheets on your bed the night of your first shower and do not  sleep with pets. Day of Surgery : Do not apply any lotions/deodorants the morning of surgery.  Please wear clean clothes to the hospital/surgery center.  FAILURE TO FOLLOW THESE INSTRUCTIONS MAY RESULT IN THE CANCELLATION OF YOUR SURGERY PATIENT SIGNATURE_________________________________  NURSE SIGNATURE__________________________________  ________________________________________________________________________   Adam Phenix  An incentive spirometer is a tool that can help keep your lungs clear and active. This tool measures how well you are filling your lungs with each breath. Taking long deep breaths may help reverse or decrease the chance of developing breathing (pulmonary) problems (especially infection) following:  A long period of time when you are unable to move or be active. BEFORE  THE PROCEDURE   If the spirometer includes an indicator to show your best effort, your nurse or respiratory therapist will set it to a desired goal.  If possible, sit up straight or lean slightly forward. Try not to slouch.  Hold the incentive spirometer in an upright position. INSTRUCTIONS FOR USE  1. Sit on the edge of your bed if possible, or sit up as far as you can in bed or on a chair. 2. Hold the incentive spirometer in an upright position. 3. Breathe out normally. 4. Place the mouthpiece in your mouth and seal your lips tightly around it. 5. Breathe in slowly and as deeply as possible, raising the piston or the ball toward the top of the column. 6. Hold your breath for 3-5 seconds or for as long as possible. Allow the piston or ball to fall to the bottom of the column. 7. Remove the mouthpiece from your mouth and breathe out normally. 8. Rest for a few seconds and repeat Steps 1 through 7 at least 10 times every 1-2 hours when you are awake. Take your time and take a few normal breaths between deep breaths. 9. The spirometer may include an indicator to show your best effort. Use the indicator as a goal to work toward during each repetition. 10. After each set of 10 deep breaths, practice coughing to be sure your lungs are clear. If you have an incision (the cut made at the time of surgery), support your incision when coughing by placing a pillow or rolled up towels firmly against it. Once you are able to get out of bed, walk around indoors and cough well. You may stop using the incentive spirometer when instructed by your caregiver.  RISKS AND COMPLICATIONS  Take your time so you do not get dizzy  or light-headed.  If you are in pain, you may need to take or ask for pain medication before doing incentive spirometry. It is harder to take a deep breath if you are having pain. AFTER USE  Rest and breathe slowly and easily.  It can be helpful to keep track of a log of your progress.  Your caregiver can provide you with a simple table to help with this. If you are using the spirometer at home, follow these instructions: [place] IF:   You are having difficultly using the spirometer.  You have trouble using the spirometer as often as instructed.  Your pain medication is not giving enough relief while using the spirometer.  You develop fever of 100.5 F (38.1 C) or higher. SEEK IMMEDIATE MEDICAL CARE IF:   You cough up bloody sputum that had not been present before.  You develop fever of 102 F (38.9 C) or greater.  You develop worsening pain at or near the incision site. MAKE SURE YOU:   Understand these instructions.  Will watch your condition.  Will get help right away if you are not doing well or get worse. Document Released: 11/10/2006 Document Revised: 09/22/2011 Document Reviewed: 01/11/2007 ExitCare Patient Information 2014 Exit[employer], [place].   ________________________________________________________________________  WHAT IS A BLOOD TRANSFUSION? Blood Transfusion Information  A transfusion is the replacement of blood or some of its parts. Blood is made up of multiple cells which provide different functions.  Red blood cells carry oxygen and are used for blood loss replacement.  White blood cells fight against infection.  Platelets control bleeding.  Plasma helps clot blood.  Other blood products are available for specialized needs, such as hemophilia or other clotting disorders. BEFORE THE TRANSFUSION  Who gives blood for transfusions?   Healthy volunteers who are fully evaluated to make sure their blood is safe. This is blood bank blood. Transfusion therapy is the safest it has ever been in the practice of medicine. Before blood is taken from a donor, a complete history is taken to make sure that person has no history of diseases nor engages in risky social behavior (examples are intravenous drug use or sexual activity with multiple  partners). The donor's travel history is screened to minimize risk of transmitting infections, such as malaria. The donated blood is tested for signs of infectious diseases, such as HIV and hepatitis. The blood is then tested to be sure it is compatible with you in order to minimize the chance of a transfusion reaction. If you or a relative donates blood, this is often done in anticipation of surgery and is not appropriate for emergency situations. It takes many days to process the donated blood. RISKS AND COMPLICATIONS Although transfusion therapy is very safe and saves many lives, the main dangers of transfusion include:   Getting an infectious disease.  Developing a transfusion reaction. This is an allergic reaction to something in the blood you were given. Every precaution is taken to prevent this. The decision to have a blood transfusion has been considered carefully by your caregiver before blood is given. Blood is not given unless the benefits outweigh the risks. AFTER THE TRANSFUSION  Right after receiving a blood transfusion, you will usually feel much better and more energetic. This is especially true if your red blood cells have gotten low (anemic). The transfusion raises the level of the red blood cells which carry oxygen, and this usually causes an energy increase.  The nurse administering the transfusion will monitor  you carefully for complications. HOME CARE INSTRUCTIONS  No special instructions are needed after a transfusion. You may find your energy is better. Speak with your caregiver about any limitations on activity for underlying diseases you may have. SEEK MEDICAL CARE IF:   Your condition is not improving after your transfusion.  You develop redness or irritation at the intravenous (IV) site. SEEK IMMEDIATE MEDICAL CARE IF:  Any of the following symptoms occur over the next 12 hours:  Shaking chills.  You have a temperature by mouth above 102 F (38.9 C), not  controlled by medicine.  Chest, back, or muscle pain.  People around you feel you are not acting correctly or are confused.  Shortness of breath or difficulty breathing.  Dizziness and fainting.  You get a rash or develop hives.  You have a decrease in urine output.  Your urine turns a dark color or changes to pink, red, or brown. Any of the following symptoms occur over the next 10 days:  You have a temperature by mouth above 102 F (38.9 C), not controlled by medicine.  Shortness of breath.  Weakness after normal activity.  The white part of the eye turns yellow (jaundice).  You have a decrease in the amount of urine or are urinating less often.  Your urine turns a dark color or changes to pink, red, or brown. Document Released: 06/27/2000 Document Revised: 09/22/2011 Document Reviewed: 02/14/2008 [facility] Patient Information 2014 [place], [place].  _______________________________________________________________________

## 2017-11-20 ENCOUNTER — Encounter (HOSPITAL_COMMUNITY): Payer: Self-pay | Admitting: *Deleted

## 2017-11-20 ENCOUNTER — Encounter (HOSPITAL_COMMUNITY)
Admission: RE | Admit: 2017-11-20 | Discharge: 2017-11-20 | Disposition: A | Discharge status: Home / Self Care | Payer: Medicare Other | Source: Ambulatory Visit | Attending: Orthopedic Surgery | Admitting: Orthopedic Surgery

## 2017-11-20 ENCOUNTER — Other Ambulatory Visit: Payer: Self-pay

## 2017-11-20 DIAGNOSIS — Z7901 Long term (current) use of anticoagulants: Secondary | ICD-10-CM | POA: Insufficient documentation

## 2017-11-20 DIAGNOSIS — Z7982 Long term (current) use of aspirin: Secondary | ICD-10-CM | POA: Insufficient documentation

## 2017-11-20 DIAGNOSIS — Z01818 Encounter for other preprocedural examination: Secondary | ICD-10-CM | POA: Insufficient documentation

## 2017-11-20 DIAGNOSIS — M1612 Unilateral primary osteoarthritis, left hip: Secondary | ICD-10-CM | POA: Insufficient documentation

## 2017-11-20 DIAGNOSIS — E7800 Pure hypercholesterolemia, unspecified: Secondary | ICD-10-CM | POA: Insufficient documentation

## 2017-11-20 DIAGNOSIS — Z79899 Other long term (current) drug therapy: Secondary | ICD-10-CM | POA: Diagnosis not present

## 2017-11-20 DIAGNOSIS — I10 Essential (primary) hypertension: Secondary | ICD-10-CM | POA: Insufficient documentation

## 2017-11-20 HISTORY — DX: Inflammatory liver disease, unspecified: K75.9

## 2017-11-20 HISTORY — DX: Other pulmonary embolism without acute cor pulmonale: I26.99

## 2017-11-20 HISTORY — DX: Unspecified osteoarthritis, unspecified site: M19.90

## 2017-11-20 LAB — COMPREHENSIVE METABOLIC PANEL
ALT: 12 U/L — ABNORMAL LOW (ref 14–54)
AST: 19 U/L (ref 15–41)
Albumin: 4 g/dL (ref 3.5–5.0)
Alkaline Phosphatase: 52 U/L (ref 38–126)
Anion gap: 9 (ref 5–15)
BUN: 17 mg/dL (ref 6–20)
CO2: 24 mmol/L (ref 22–32)
Calcium: 9 mg/dL (ref 8.9–10.3)
Chloride: 107 mmol/L (ref 101–111)
Creatinine, Ser: 0.68 mg/dL (ref 0.44–1.00)
GFR calc Af Amer: >60 mL/min (ref >60)
GFR calc non Af Amer: >60 mL/min (ref >60)
Glucose, Bld: 99 mg/dL (ref 65–99)
Potassium: 3.8 mmol/L (ref 3.5–5.1)
Sodium: 140 mmol/L (ref 135–145)
Total Bilirubin: 0.4 mg/dL (ref 0.3–1.2)
Total Protein: 6.7 g/dL (ref 6.5–8.1)

## 2017-11-20 LAB — CBC
HCT: 41.4 % (ref 36.0–46.0)
Hemoglobin: 13.4 g/dL (ref 12.0–15.0)
MCH: 28.8 pg (ref 26.0–34.0)
MCHC: 32.4 g/dL (ref 30.0–36.0)
MCV: 89 fL (ref 78.0–100.0)
Platelets: 276 10*3/uL (ref 150–400)
RBC: 4.65 MIL/uL (ref 3.87–5.11)
RDW: 12.8 % (ref 11.5–15.5)
WBC: 6.4 10*3/uL (ref 4.0–10.5)

## 2017-11-20 LAB — PROTIME-INR
INR: 0.94
Prothrombin Time: 12.4 seconds (ref 11.4–15.2)

## 2017-11-20 LAB — URINALYSIS, ROUTINE W REFLEX MICROSCOPIC
Bacteria, UA: NONE SEEN
Bilirubin Urine: NEGATIVE
Glucose, UA: NEGATIVE mg/dL
Hgb urine dipstick: NEGATIVE
Ketones, ur: 5 mg/dL — AB
Nitrite: NEGATIVE
Protein, ur: NEGATIVE mg/dL
Specific Gravity, Urine: 1.017 (ref 1.005–1.030)
pH: 5 (ref 5.0–8.0)

## 2017-11-20 LAB — SURGICAL PCR SCREEN
MRSA, PCR: NEGATIVE
STAPHYLOCOCCUS AUREUS: NEGATIVE

## 2017-11-20 LAB — APTT: aPTT: 24 seconds (ref 24–36)

## 2017-11-20 LAB — ABO/RH: ABO/RH(D): A POS

## 2017-11-20 NOTE — Progress Notes (Signed)
UA RESULTS FAXED TO DR Wynelle Link BY EP[employer]

## 2017-11-22 NOTE — H&P (Signed)
TOTAL HIP ADMISSION H&P  Patient is admitted for left total hip arthroplasty.  Subjective:  Chief Complaint: left hip pain  HPI: Christina Clay, 67 y.o. female, has a history of pain and functional disability in the left hip(s) due to arthritis and patient has failed non-surgical conservative treatments for greater than 12 weeks to include NSAID's and/or analgesics, corticosteriod injections, flexibility and strengthening excercises and activity modification.  Onset of symptoms was gradual starting 1 year ago with rapidlly worsening course since that time.The patient noted no past surgery on the left hip(s).  Patient currently rates pain in the left hip at 8 out of 10 with activity. Patient has night pain, worsening of pain with activity and weight bearing, pain that interfers with activities of daily living and pain with passive range of motion. Patient has evidence of subchondral cysts, periarticular osteophytes and joint space narrowing by imaging studies. This condition presents safety issues increasing the risk of falls. There is no current active infection.  Patient Active Problem List   Diagnosis Date Noted  . OBESITY, UNSPECIFIED 10/09/2009  . MYOCARDIAL PERFUSION SCAN, WITH STRESS TEST, ABNORMAL 10/09/2009  . PURE HYPERCHOLESTEROLEMIA 10/08/2009  . HYPERTENSION 10/08/2009  . SHORTNESS OF BREATH 10/08/2009  . CHEST PAIN UNSPECIFIED 10/08/2009   Past Medical History:  Diagnosis Date  . Arthritis    OA  . DJD (degenerative joint disease)    LOWER BACK  . Hepatitis   . HTN (hypertension)   . Hypercholesterolemia   . Pulmonary embolism ([place]) 03/2013    2 DAYS AFTER AFTER RIGHT HIP REPLACEMENT SURGERY TOOK XARELTO FOR A FEW MONTHS SURGERY WAS CAUSE    Past Surgical History:  Procedure Laterality Date  . ABDOMINAL HYSTERECTOMY  1993   COMPLETE  . chin lift  6-7- YRS AGO  . JOINT REPLACEMENT  03/2013   RIGHT THA POSTERIOR  . TONSILLECTOMY  AS CHILD       Current  Outpatient Medications  Medication Sig Dispense Refill Last Dose  . amLODipine (NORVASC) 2.5 MG tablet Take 2.5 mg by mouth daily.     . celecoxib (CELEBREX) 200 MG capsule Take 200 mg by mouth daily.     . Eszopiclone 3 MG TABS Take 3 mg by mouth at bedtime. Take immediately before bedtime      . ibuprofen (ADVIL,MOTRIN) 800 MG tablet Take 800 mg by mouth every 8 (eight) hours as needed (for pain.).   11/18/2017 at 800  . losartan (COZAAR) 25 MG tablet Take 25 mg by mouth daily.     . pravastatin (PRAVACHOL) 40 MG tablet Take 20 mg by mouth daily.     Marland Kitchen amoxicillin (AMOXIL) 500 MG capsule Take 2,000 mg by mouth See admin instructions. Take 4 capsules (2000 mg) by mouth 1 hour prior to dental appointments.  99   . aspirin 81 MG tablet Take 81 mg by mouth daily.   11/15/2017 at 800   Allergies  Allergen Reactions  . Naproxen Sodium Hives, Shortness Of Breath, Itching and Rash    Social History   Tobacco Use  . Smoking status: Never Smoker  . Smokeless tobacco: Never Used  Substance Use Topics  . Alcohol use: No    Family History  Problem Relation Age of Onset  . Cancer Mother   . Coronary artery disease Unknown      Review of Systems  Constitutional: Negative.   HENT: Negative.   Eyes: Negative.   Respiratory: Negative.   Cardiovascular: Negative.   Gastrointestinal: Negative.  Genitourinary: Negative.   Musculoskeletal: Positive for joint pain and myalgias. Negative for back pain, falls and neck pain.  Skin: Negative.   Neurological: Negative.   Endo/Heme/Allergies: Negative.   Psychiatric/Behavioral: Negative.     Objective:  Physical Exam  Constitutional: She is oriented to person, place, and time. She appears well-developed. No distress.  Obese  HENT:  Head: Normocephalic and atraumatic.  Right Ear: External ear normal.  Left Ear: External ear normal.  Nose: Nose normal.  Mouth/Throat: Oropharynx is clear and moist.  Eyes: Conjunctivae and EOM are normal.   Neck: Normal range of motion. Neck supple.  Cardiovascular: Normal rate, regular rhythm, normal heart sounds and intact distal pulses.  No murmur heard. Respiratory: Effort normal and breath sounds normal. No respiratory distress. She has no wheezes.  GI: Soft. Bowel sounds are normal. She exhibits no distension. There is no tenderness.  Musculoskeletal:       Right knee: Normal.       Left knee: Normal.  Right Hip Exam: ROM: Flexion to 120 degrees, Rotation 20 degrees and abduction 30 degrees without discomfort.  There is no tenderness over the greater trochanter.  There is no pain on provocative testing of the hip.   Left Hip Exam: ROM: Flexion to 90 degrees, no internal rotation, external rotation to 10 degrees, 10 degrees abduction without discomfort.  No internal rotation, external rotation to 10 degrees.  Tenderness over the greater trochanter.   Neurological: She is alert and oriented to person, place, and time.  Skin: No rash noted. She is not diaphoretic. No erythema.  Psychiatric: She has a normal mood and affect. Her behavior is normal.    Vitals Ht: 5 ft 4 in  Wt: 186 lbs  BMI: 31.9  BP: 158/90  Pulse: 92 bpm    Imaging Review Plain radiographs demonstrate severe degenerative joint disease of the left hip(s). The bone quality appears to be good for age and reported activity level.    Preoperative templating of the joint replacement has been completed, documented, and submitted to the Operating Room personnel in order to optimize intra-operative equipment management.    Assessment/Plan:  End stage primary osteoarthritis, left hip(s)  The patient history, physical examination, clinical judgement of the provider and imaging studies are consistent with end stage degenerative joint disease of the left hip(s) and total hip arthroplasty is deemed medically necessary. The treatment options including medical management, injection therapy, arthroscopy and arthroplasty  were discussed at length. The risks and benefits of total hip arthroplasty were presented and reviewed. The risks due to aseptic loosening, infection, stiffness, dislocation/subluxation,  thromboembolic complications and other imponderables were discussed.  The patient acknowledged the explanation, agreed to proceed with the plan and consent was signed. Patient is being admitted for inpatient treatment for surgery, pain control, PT, OT, prophylactic antibiotics, VTE prophylaxis, progressive ambulation and ADL's and discharge planning.The patient is planning to be discharged home with HEP.    Therapy Plans: HEP Disposition: Home with husband Planned DVT prophylaxis: Xarelto 10mg  daily DME needed: none PCP: Dr. Manuella Ghazi Other: topical TXA; hx of PE    Ardeen Jourdain, PA-C

## 2017-11-24 MED ORDER — TRANEXAMIC ACID 1000 MG/10ML IV SOLN
2000.0000 mg | INTRAVENOUS | Status: AC
  Filled 2017-11-24: qty 20

## 2017-11-25 ENCOUNTER — Inpatient Hospital Stay (HOSPITAL_COMMUNITY): Payer: Medicare Other

## 2017-11-25 ENCOUNTER — Other Ambulatory Visit: Payer: Self-pay

## 2017-11-25 ENCOUNTER — Encounter (HOSPITAL_COMMUNITY)
Admission: RE | Disposition: A | Discharge status: Home / Self Care | Payer: Self-pay | Source: Ambulatory Visit | Attending: Orthopedic Surgery

## 2017-11-25 ENCOUNTER — Inpatient Hospital Stay (HOSPITAL_COMMUNITY)
Admission: RE | Admit: 2017-11-25 | Discharge: 2017-11-26 | DRG: 470 | Disposition: A | Discharge status: Home / Self Care | Payer: Medicare Other | Source: Ambulatory Visit | Attending: Orthopedic Surgery | Admitting: Orthopedic Surgery

## 2017-11-25 ENCOUNTER — Inpatient Hospital Stay (HOSPITAL_COMMUNITY): Payer: Medicare Other | Admitting: Anesthesiology

## 2017-11-25 ENCOUNTER — Encounter (HOSPITAL_COMMUNITY): Payer: Self-pay | Admitting: Anesthesiology

## 2017-11-25 DIAGNOSIS — Z6830 Body mass index (BMI) 30.0-30.9, adult: Secondary | ICD-10-CM

## 2017-11-25 DIAGNOSIS — I10 Essential (primary) hypertension: Secondary | ICD-10-CM | POA: Diagnosis present

## 2017-11-25 DIAGNOSIS — Z886 Allergy status to analgesic agent status: Secondary | ICD-10-CM

## 2017-11-25 DIAGNOSIS — Z96649 Presence of unspecified artificial hip joint: Secondary | ICD-10-CM

## 2017-11-25 DIAGNOSIS — Z79899 Other long term (current) drug therapy: Secondary | ICD-10-CM | POA: Diagnosis not present

## 2017-11-25 DIAGNOSIS — M1612 Unilateral primary osteoarthritis, left hip: Secondary | ICD-10-CM | POA: Diagnosis present

## 2017-11-25 DIAGNOSIS — Z7982 Long term (current) use of aspirin: Secondary | ICD-10-CM | POA: Diagnosis not present

## 2017-11-25 DIAGNOSIS — Z86711 Personal history of pulmonary embolism: Secondary | ICD-10-CM

## 2017-11-25 DIAGNOSIS — M169 Osteoarthritis of hip, unspecified: Secondary | ICD-10-CM | POA: Diagnosis present

## 2017-11-25 DIAGNOSIS — Z96641 Presence of right artificial hip joint: Secondary | ICD-10-CM | POA: Diagnosis present

## 2017-11-25 DIAGNOSIS — Z471 Aftercare following joint replacement surgery: Secondary | ICD-10-CM | POA: Diagnosis not present

## 2017-11-25 DIAGNOSIS — M25752 Osteophyte, left hip: Secondary | ICD-10-CM | POA: Diagnosis present

## 2017-11-25 DIAGNOSIS — E669 Obesity, unspecified: Secondary | ICD-10-CM | POA: Diagnosis present

## 2017-11-25 DIAGNOSIS — E7800 Pure hypercholesterolemia, unspecified: Secondary | ICD-10-CM | POA: Diagnosis present

## 2017-11-25 DIAGNOSIS — Z96643 Presence of artificial hip joint, bilateral: Secondary | ICD-10-CM | POA: Diagnosis not present

## 2017-11-25 DIAGNOSIS — Z90710 Acquired absence of both cervix and uterus: Secondary | ICD-10-CM

## 2017-11-25 HISTORY — PX: TOTAL HIP ARTHROPLASTY: SHX124

## 2017-11-25 LAB — TYPE AND SCREEN
ABO/RH(D): A POS
Antibody Screen: NEGATIVE

## 2017-11-25 SURGERY — ARTHROPLASTY, HIP, TOTAL, ANTERIOR APPROACH
Anesthesia: Spinal | Site: Hip | Laterality: Left

## 2017-11-25 MED ORDER — OXYCODONE HCL 5 MG PO TABS
5.0000 mg | ORAL_TABLET | ORAL | Status: DC | PRN
  Administered 2017-11-25: 5 mg via ORAL
  Administered 2017-11-25 (×2): 10 mg via ORAL
  Administered 2017-11-25 – 2017-11-26 (×2): 5 mg via ORAL
  Filled 2017-11-25: qty 2
  Filled 2017-11-25 (×2): qty 1
  Filled 2017-11-25 (×2): qty 2

## 2017-11-25 MED ORDER — TRAMADOL HCL 50 MG PO TABS
50.0000 mg | ORAL_TABLET | Freq: Four times a day (QID) | ORAL | Status: DC | PRN
  Administered 2017-11-26: 50 mg via ORAL
  Filled 2017-11-25: qty 2

## 2017-11-25 MED ORDER — MIDAZOLAM HCL 2 MG/2ML IJ SOLN
INTRAMUSCULAR | Status: AC
  Filled 2017-11-25: qty 2

## 2017-11-25 MED ORDER — CHLORHEXIDINE GLUCONATE 4 % EX LIQD: 60.0000 mL | Freq: Once | CUTANEOUS | Status: DC

## 2017-11-25 MED ORDER — MEPERIDINE HCL 50 MG/ML IJ SOLN: 6.2500 mg | INTRAMUSCULAR | Status: DC | PRN

## 2017-11-25 MED ORDER — FENTANYL CITRATE (PF) 100 MCG/2ML IJ SOLN
INTRAMUSCULAR | Status: AC
  Filled 2017-11-25: qty 2

## 2017-11-25 MED ORDER — ONDANSETRON HCL 4 MG PO TABS: 4.0000 mg | ORAL_TABLET | Freq: Four times a day (QID) | ORAL | Status: DC | PRN

## 2017-11-25 MED ORDER — PHENOL 1.4 % MT LIQD: 1.0000 | OROMUCOSAL | Status: DC | PRN

## 2017-11-25 MED ORDER — LOSARTAN POTASSIUM 25 MG PO TABS: 25.0000 mg | ORAL_TABLET | Freq: Every day | ORAL | Status: DC

## 2017-11-25 MED ORDER — CEFAZOLIN SODIUM-DEXTROSE 2-4 GM/100ML-% IV SOLN
2.0000 g | INTRAVENOUS | Status: AC
  Administered 2017-11-25: 2 g via INTRAVENOUS

## 2017-11-25 MED ORDER — ACETAMINOPHEN 325 MG PO TABS: 325.0000 mg | ORAL_TABLET | Freq: Four times a day (QID) | ORAL | Status: DC | PRN

## 2017-11-25 MED ORDER — METOCLOPRAMIDE HCL 5 MG/ML IJ SOLN: 5.0000 mg | Freq: Three times a day (TID) | INTRAMUSCULAR | Status: DC | PRN

## 2017-11-25 MED ORDER — DEXAMETHASONE SODIUM PHOSPHATE 10 MG/ML IJ SOLN
INTRAMUSCULAR | Status: AC
  Filled 2017-11-25: qty 1

## 2017-11-25 MED ORDER — MIDAZOLAM HCL 5 MG/5ML IJ SOLN
INTRAMUSCULAR | Status: DC | PRN
  Administered 2017-11-25: 2 mg via INTRAVENOUS

## 2017-11-25 MED ORDER — ACETAMINOPHEN 10 MG/ML IV SOLN
1000.0000 mg | Freq: Once | INTRAVENOUS | Status: AC
  Administered 2017-11-25: 1000 mg via INTRAVENOUS

## 2017-11-25 MED ORDER — ACETAMINOPHEN 10 MG/ML IV SOLN: 1000.0000 mg | Freq: Four times a day (QID) | INTRAVENOUS | Status: DC

## 2017-11-25 MED ORDER — CEFAZOLIN SODIUM-DEXTROSE 2-4 GM/100ML-% IV SOLN
INTRAVENOUS | Status: AC
  Filled 2017-11-25: qty 100

## 2017-11-25 MED ORDER — DEXTROSE 5 % IV SOLN
500.0000 mg | Freq: Four times a day (QID) | INTRAVENOUS | Status: DC | PRN
  Administered 2017-11-25: 500 mg via INTRAVENOUS
  Filled 2017-11-25: qty 550

## 2017-11-25 MED ORDER — DEXAMETHASONE SODIUM PHOSPHATE 10 MG/ML IJ SOLN
10.0000 mg | Freq: Once | INTRAMUSCULAR | Status: AC
  Administered 2017-11-26: 10 mg via INTRAVENOUS
  Filled 2017-11-25: qty 1

## 2017-11-25 MED ORDER — ONDANSETRON HCL 4 MG/2ML IJ SOLN
INTRAMUSCULAR | Status: AC
  Filled 2017-11-25: qty 2

## 2017-11-25 MED ORDER — POLYETHYLENE GLYCOL 3350 17 G PO PACK: 17.0000 g | PACK | Freq: Every day | ORAL | Status: DC | PRN

## 2017-11-25 MED ORDER — DEXAMETHASONE SODIUM PHOSPHATE 10 MG/ML IJ SOLN
8.0000 mg | Freq: Once | INTRAMUSCULAR | Status: AC
  Administered 2017-11-25: 10 mg via INTRAVENOUS

## 2017-11-25 MED ORDER — PROPOFOL 10 MG/ML IV BOLUS
INTRAVENOUS | Status: AC
  Filled 2017-11-25: qty 60

## 2017-11-25 MED ORDER — ACETAMINOPHEN 500 MG PO TABS
1000.0000 mg | ORAL_TABLET | Freq: Four times a day (QID) | ORAL | Status: AC
Start: 2017-11-25 — End: 2017-11-26
  Administered 2017-11-25 – 2017-11-26 (×4): 1000 mg via ORAL
  Filled 2017-11-25 (×5): qty 2

## 2017-11-25 MED ORDER — CEFAZOLIN SODIUM-DEXTROSE 2-4 GM/100ML-% IV SOLN
2.0000 g | Freq: Four times a day (QID) | INTRAVENOUS | Status: AC
  Administered 2017-11-25 (×2): 2 g via INTRAVENOUS
  Filled 2017-11-25 (×2): qty 100

## 2017-11-25 MED ORDER — METOCLOPRAMIDE HCL 5 MG PO TABS: 5.0000 mg | ORAL_TABLET | Freq: Three times a day (TID) | ORAL | Status: DC | PRN

## 2017-11-25 MED ORDER — STERILE WATER FOR IRRIGATION IR SOLN
Status: DC | PRN
  Administered 2017-11-25: 2000 mL

## 2017-11-25 MED ORDER — ONDANSETRON HCL 4 MG/2ML IJ SOLN: 4.0000 mg | Freq: Four times a day (QID) | INTRAMUSCULAR | Status: DC | PRN

## 2017-11-25 MED ORDER — FENTANYL CITRATE (PF) 100 MCG/2ML IJ SOLN
INTRAMUSCULAR | Status: DC | PRN
  Administered 2017-11-25 (×3): 50 ug via INTRAVENOUS

## 2017-11-25 MED ORDER — PHENYLEPHRINE 40 MCG/ML (10ML) SYRINGE FOR IV PUSH (FOR BLOOD PRESSURE SUPPORT)
PREFILLED_SYRINGE | INTRAVENOUS | Status: AC
  Filled 2017-11-25: qty 10

## 2017-11-25 MED ORDER — BUPIVACAINE HCL (PF) 0.25 % IJ SOLN
INTRAMUSCULAR | Status: AC
  Filled 2017-11-25: qty 30

## 2017-11-25 MED ORDER — PROPOFOL 500 MG/50ML IV EMUL
INTRAVENOUS | Status: DC | PRN
Start: 2017-11-25 — End: 2017-11-25
  Administered 2017-11-25: 75 ug/kg/min via INTRAVENOUS

## 2017-11-25 MED ORDER — PRAVASTATIN SODIUM 20 MG PO TABS
20.0000 mg | ORAL_TABLET | Freq: Every day | ORAL | Status: DC
  Administered 2017-11-26: 20 mg via ORAL
  Filled 2017-11-25: qty 1

## 2017-11-25 MED ORDER — DIPHENHYDRAMINE HCL 12.5 MG/5ML PO ELIX: 12.5000 mg | ORAL_SOLUTION | ORAL | Status: DC | PRN

## 2017-11-25 MED ORDER — BISACODYL 10 MG RE SUPP: 10.0000 mg | Freq: Every day | RECTAL | Status: DC | PRN

## 2017-11-25 MED ORDER — MENTHOL 3 MG MT LOZG: 1.0000 | LOZENGE | OROMUCOSAL | Status: DC | PRN

## 2017-11-25 MED ORDER — ONDANSETRON HCL 4 MG/2ML IJ SOLN
INTRAMUSCULAR | Status: DC | PRN
  Administered 2017-11-25: 4 mg via INTRAVENOUS

## 2017-11-25 MED ORDER — HYDROMORPHONE HCL 1 MG/ML IJ SOLN
INTRAMUSCULAR | Status: AC
  Administered 2017-11-25: 0.5 mg via INTRAVENOUS
  Filled 2017-11-25: qty 2

## 2017-11-25 MED ORDER — TRANEXAMIC ACID 1000 MG/10ML IV SOLN
INTRAVENOUS | Status: DC | PRN
  Administered 2017-11-25: 2000 mg via TOPICAL

## 2017-11-25 MED ORDER — SODIUM CHLORIDE 0.9 % IR SOLN
Status: DC | PRN
  Administered 2017-11-25: 1000 mL

## 2017-11-25 MED ORDER — DOCUSATE SODIUM 100 MG PO CAPS
100.0000 mg | ORAL_CAPSULE | Freq: Two times a day (BID) | ORAL | Status: DC
  Administered 2017-11-25 – 2017-11-26 (×2): 100 mg via ORAL
  Filled 2017-11-25 (×2): qty 1

## 2017-11-25 MED ORDER — HYDROMORPHONE HCL 1 MG/ML IJ SOLN
0.2500 mg | INTRAMUSCULAR | Status: DC | PRN
  Administered 2017-11-25 (×3): 0.5 mg via INTRAVENOUS

## 2017-11-25 MED ORDER — PROMETHAZINE HCL 25 MG/ML IJ SOLN: 6.2500 mg | INTRAMUSCULAR | Status: DC | PRN

## 2017-11-25 MED ORDER — LACTATED RINGERS IV SOLN
INTRAVENOUS | Status: DC
  Administered 2017-11-25 (×2): via INTRAVENOUS

## 2017-11-25 MED ORDER — HYDROMORPHONE HCL 1 MG/ML IJ SOLN
0.5000 mg | INTRAMUSCULAR | Status: DC | PRN
  Administered 2017-11-25: 0.5 mg via INTRAVENOUS
  Filled 2017-11-25: qty 1

## 2017-11-25 MED ORDER — RIVAROXABAN 10 MG PO TABS: 10.0000 mg | ORAL_TABLET | Freq: Every day | ORAL | 0 refills | Status: DC

## 2017-11-25 MED ORDER — ZOLPIDEM TARTRATE 5 MG PO TABS: 5.0000 mg | ORAL_TABLET | Freq: Every evening | ORAL | Status: DC | PRN

## 2017-11-25 MED ORDER — FLEET ENEMA 7-19 GM/118ML RE ENEM: 1.0000 | ENEMA | Freq: Once | RECTAL | Status: DC | PRN

## 2017-11-25 MED ORDER — METHOCARBAMOL 500 MG PO TABS: 500.0000 mg | ORAL_TABLET | Freq: Four times a day (QID) | ORAL | 0 refills | Status: DC | PRN

## 2017-11-25 MED ORDER — BUPIVACAINE IN DEXTROSE 0.75-8.25 % IT SOLN
INTRATHECAL | Status: DC | PRN
  Administered 2017-11-25: 2 mL via INTRATHECAL

## 2017-11-25 MED ORDER — SODIUM CHLORIDE 0.9 % IV SOLN
INTRAVENOUS | Status: DC
  Administered 2017-11-25: 75 mL/h via INTRAVENOUS

## 2017-11-25 MED ORDER — BUPIVACAINE HCL (PF) 0.25 % IJ SOLN
INTRAMUSCULAR | Status: DC | PRN
  Administered 2017-11-25: 30 mL

## 2017-11-25 MED ORDER — PROPOFOL 10 MG/ML IV BOLUS
INTRAVENOUS | Status: DC | PRN
  Administered 2017-11-25: 20 mg via INTRAVENOUS
  Administered 2017-11-25: 30 mg via INTRAVENOUS

## 2017-11-25 MED ORDER — ACETAMINOPHEN 10 MG/ML IV SOLN
INTRAVENOUS | Status: AC
  Filled 2017-11-25: qty 100

## 2017-11-25 MED ORDER — RIVAROXABAN 10 MG PO TABS
10.0000 mg | ORAL_TABLET | Freq: Every day | ORAL | Status: DC
  Administered 2017-11-26: 10 mg via ORAL
  Filled 2017-11-25: qty 1

## 2017-11-25 MED ORDER — PHENYLEPHRINE 40 MCG/ML (10ML) SYRINGE FOR IV PUSH (FOR BLOOD PRESSURE SUPPORT)
PREFILLED_SYRINGE | INTRAVENOUS | Status: DC | PRN
  Administered 2017-11-25: 40 ug via INTRAVENOUS

## 2017-11-25 MED ORDER — OXYCODONE HCL 5 MG PO TABS: 10.0000 mg | ORAL_TABLET | ORAL | Status: DC | PRN

## 2017-11-25 MED ORDER — METHOCARBAMOL 500 MG PO TABS
500.0000 mg | ORAL_TABLET | Freq: Four times a day (QID) | ORAL | Status: DC | PRN
  Filled 2017-11-25: qty 1

## 2017-11-25 MED ORDER — AMLODIPINE BESYLATE 5 MG PO TABS: 2.5000 mg | ORAL_TABLET | Freq: Every day | ORAL | Status: DC

## 2017-11-25 SURGICAL SUPPLY — 38 items
BAG DECANTER FOR FLEXI CONT (MISCELLANEOUS) ×3 IMPLANT
BAG SPEC THK2 15X12 ZIP CLS (MISCELLANEOUS)
BAG ZIPLOCK 12X15 (MISCELLANEOUS) IMPLANT
BLADE EXTENDED COATED 6.5IN (ELECTRODE) ×3 IMPLANT
BLADE SAG 18X100X1.27 (BLADE) ×3 IMPLANT
CAPT HIP TOTAL 2 ×2 IMPLANT
CLOSURE WOUND 1/2 X4 (GAUZE/BANDAGES/DRESSINGS) ×1
CLOTH BEACON ORANGE TIMEOUT ST (SAFETY) ×3 IMPLANT
COVER PERINEAL POST (MISCELLANEOUS) ×3 IMPLANT
COVER SURGICAL LIGHT HANDLE (MISCELLANEOUS) ×3 IMPLANT
DECANTER SPIKE VIAL GLASS SM (MISCELLANEOUS) ×3 IMPLANT
DRAPE STERI IOBAN 125X83 (DRAPES) ×3 IMPLANT
DRAPE U-SHAPE 47X51 STRL (DRAPES) ×6 IMPLANT
DRSG ADAPTIC 3X8 NADH LF (GAUZE/BANDAGES/DRESSINGS) ×3 IMPLANT
DRSG MEPILEX BORDER 4X4 (GAUZE/BANDAGES/DRESSINGS) ×3 IMPLANT
DRSG MEPILEX BORDER 4X8 (GAUZE/BANDAGES/DRESSINGS) ×3 IMPLANT
DURAPREP 26ML APPLICATOR (WOUND CARE) ×3 IMPLANT
ELECT REM PT RETURN 15FT ADLT (MISCELLANEOUS) ×3 IMPLANT
EVACUATOR 1/8 PVC DRAIN (DRAIN) ×3 IMPLANT
GLOVE BIO SURGEON STRL SZ8 (GLOVE) ×4 IMPLANT
GLOVE BIOGEL PI IND STRL 6.5 (GLOVE) ×1 IMPLANT
GLOVE BIOGEL PI IND STRL 8 (GLOVE) ×2 IMPLANT
GLOVE BIOGEL PI INDICATOR 6.5 (GLOVE) ×12
GLOVE BIOGEL PI INDICATOR 8 (GLOVE) ×2
GLOVE SURG SS PI 6.5 STRL IVOR (GLOVE) ×3 IMPLANT
GOWN STRL REUS W/TWL LRG LVL3 (GOWN DISPOSABLE) ×7 IMPLANT
GOWN STRL REUS W/TWL XL LVL3 (GOWN DISPOSABLE) ×3 IMPLANT
PACK ANTERIOR HIP CUSTOM (KITS) ×3 IMPLANT
STRIP CLOSURE SKIN 1/2X4 (GAUZE/BANDAGES/DRESSINGS) ×2 IMPLANT
SUT ETHIBOND NAB CT1 #1 30IN (SUTURE) ×3 IMPLANT
SUT MNCRL AB 4-0 PS2 18 (SUTURE) ×3 IMPLANT
SUT STRATAFIX 0 PDS 27 VIOLET (SUTURE) ×3
SUT VIC AB 2-0 CT1 27 (SUTURE) ×6
SUT VIC AB 2-0 CT1 TAPERPNT 27 (SUTURE) ×2 IMPLANT
SUTURE STRATFX 0 PDS 27 VIOLET (SUTURE) ×1 IMPLANT
SYR 50ML LL SCALE MARK (SYRINGE) IMPLANT
TRAY FOLEY CATH 14FR (SET/KITS/TRAYS/PACK) ×2 IMPLANT
YANKAUER SUCT BULB TIP 10FT TU (MISCELLANEOUS) ×3 IMPLANT

## 2017-11-25 NOTE — Transfer of Care (Signed)
Immediate Anesthesia Transfer of Care Note  Patient: Christina Clay  Procedure(s) Performed: LEFT TOTAL HIP ARTHROPLASTY ANTERIOR APPROACH (Left Hip)  Patient Location: PACU  Anesthesia Type:Spinal  Level of Consciousness: awake, alert  and patient cooperative  Airway & Oxygen Therapy: Patient Spontanous Breathing and Patient connected to face mask oxygen  Post-op Assessment: Report given to RN and Post -op Vital signs reviewed and stable  Post vital signs: Reviewed and stable  Last Vitals:  Vitals Value Taken Time  BP 106/57 11/25/2017 12:40 PM  Temp    Pulse 62 11/25/2017 12:43 PM  Resp 18 11/25/2017 12:43 PM  SpO2 100 % 11/25/2017 12:43 PM  Vitals shown include unvalidated device data.  Last Pain:  Vitals:   11/25/17 1030  TempSrc:   PainSc: 8       Patients Stated Pain Goal: 6 (36/46/80 3212)  Complications: No apparent anesthesia complications

## 2017-11-25 NOTE — Anesthesia Postprocedure Evaluation (Signed)
Anesthesia Post Note  Patient: Christina Clay  Procedure(s) Performed: LEFT TOTAL HIP ARTHROPLASTY ANTERIOR APPROACH (Left Hip)     Patient location during evaluation: PACU Anesthesia Type: Spinal Level of consciousness: awake and alert Pain management: pain level controlled Vital Signs Assessment: post-procedure vital signs reviewed and stable Respiratory status: spontaneous breathing and respiratory function stable Cardiovascular status: blood pressure returned to baseline and stable Postop Assessment: spinal receding Anesthetic complications: no    Last Vitals:  Vitals:   11/25/17 1633 11/25/17 1751  BP: (!) 115/55 (!) 118/54  Pulse: 64 65  Resp: 15 20  Temp: 36.6 C 36.8 C  SpO2: 99% 100%    Last Pain:  Vitals:   11/25/17 1853  TempSrc:   PainSc: [place]

## 2017-11-25 NOTE — Anesthesia Procedure Notes (Signed)
Spinal  Patient location during procedure: OR Staffing Anesthesiologist: Nolon Nations, MD Performed: anesthesiologist  Preanesthetic Checklist Completed: patient identified, site marked, surgical consent, pre-op evaluation, timeout performed, IV checked, risks and benefits discussed and monitors and equipment checked Spinal Block Patient position: sitting Prep: site prepped and draped and DuraPrep Patient monitoring: heart rate, continuous pulse ox and blood pressure Approach: right paramedian Location: L2-3 Injection technique: single-shot Needle Needle type: Sprotte  Needle gauge: 24 G Needle length: 9 cm Additional Notes Expiration date of kit checked and confirmed. Patient tolerated procedure well, without complications.

## 2017-11-25 NOTE — Anesthesia Preprocedure Evaluation (Addendum)
Anesthesia Evaluation  Patient identified by MRN, date of birth, ID band Patient awake    Reviewed: Allergy & Precautions, NPO status , Patient's Chart, lab work & pertinent test results  Airway Mallampati: II  TM Distance: >3 FB Neck ROM: Full    Dental no notable dental hx.    Pulmonary neg pulmonary ROS,    Pulmonary exam normal breath sounds clear to auscultation       Cardiovascular hypertension, Pt. on medications Normal cardiovascular exam Rhythm:Regular Rate:Normal     Neuro/Psych negative neurological ROS  negative psych ROS   GI/Hepatic negative GI ROS, (+) Hepatitis -  Endo/Other  negative endocrine ROS  Renal/GU negative Renal ROS     Musculoskeletal  (+) Arthritis ,   Abdominal   Peds  Hematology negative hematology ROS (+)   Anesthesia Other Findings   Reproductive/Obstetrics negative OB ROS                             Anesthesia Physical Anesthesia Plan  ASA: II  Anesthesia Plan: Spinal   Post-op Pain Management:    Induction:   PONV Risk Score and Plan: 2  Airway Management Planned:   Additional Equipment:   Intra-op Plan:   Post-operative Plan:   Informed Consent: I have reviewed the patients History and Physical, chart, labs and discussed the procedure including the risks, benefits and alternatives for the proposed anesthesia with the patient or authorized representative who has indicated his/her understanding and acceptance.   Dental advisory given  Plan Discussed with: CRNA  Anesthesia Plan Comments:         Anesthesia Quick Evaluation

## 2017-11-25 NOTE — Op Note (Signed)
OPERATIVE REPORT- TOTAL HIP ARTHROPLASTY   PREOPERATIVE DIAGNOSIS: Osteoarthritis of the Left hip.   POSTOPERATIVE DIAGNOSIS: Osteoarthritis of the Left  hip.   PROCEDURE: Left total hip arthroplasty, anterior approach.   SURGEON: Gaynelle Arabian, MD   ASSISTANT: Ardeen Jourdain, PA-C  ANESTHESIA:  Spinal  ESTIMATED BLOOD LOSS:-200 mL    DRAINS: Hemovac x1.   COMPLICATIONS: None   CONDITION: PACU - hemodynamically stable.   BRIEF CLINICAL NOTE: Christina Clay is a 67 y.o. female who has advanced end-  stage arthritis of their Left  hip with progressively worsening pain and  dysfunction.The patient has failed nonoperative management and presents for  total hip arthroplasty.   PROCEDURE IN DETAIL: After successful administration of spinal  anesthetic, the traction boots for the [facility] bed were placed on both  feet and the patient was placed onto the [facility] bed, boots placed into the leg  holders. The Left hip was then isolated from the perineum with plastic  drapes and prepped and draped in the usual sterile fashion. ASIS and  greater trochanter were marked and a oblique incision was made, starting  at about 1 cm lateral and 2 cm distal to the ASIS and coursing towards  the anterior cortex of the femur. The skin was cut with a 10 blade  through subcutaneous tissue to the level of the fascia overlying the  tensor fascia lata muscle. The fascia was then incised in line with the  incision at the junction of the anterior third and posterior 2/3rd. The  muscle was teased off the fascia and then the interval between the TFL  and the rectus was developed. The Hohmann retractor was then placed at  the top of the femoral neck over the capsule. The vessels overlying the  capsule were cauterized and the fat on top of the capsule was removed.  A Hohmann retractor was then placed anterior underneath the rectus  femoris to give exposure to the entire anterior capsule. A T-shaped   capsulotomy was performed. The edges were tagged and the femoral head  was identified.       Osteophytes are removed off the superior acetabulum.  The femoral neck was then cut in situ with an oscillating saw. Traction  was then applied to the left lower extremity utilizing the [facility]  traction. The femoral head was then removed. Retractors were placed  around the acetabulum and then circumferential removal of the labrum was  performed. Osteophytes were also removed. Reaming starts at 45 mm to  medialize and  Increased in 2 mm increments to 49 mm. We reamed in  approximately 40 degrees of abduction, 20 degrees anteversion. A 50 mm  pinnacle acetabular shell was then impacted in anatomic position under  fluoroscopic guidance with excellent purchase. We did not need to place  any additional dome screws. A 32 mm neutral + 4 marathon liner was then  placed into the acetabular shell.       The femoral lift was then placed along the lateral aspect of the femur  just distal to the vastus ridge. The leg was  externally rotated and capsule  was stripped off the inferior aspect of the femoral neck down to the  level of the lesser trochanter, this was done with electrocautery. The femur was lifted after this was performed. The  leg was then placed in an extended and adducted position essentially delivering the femur. We also removed the capsule superiorly and the piriformis from the piriformis  fossa to gain excellent exposure of the  proximal femur. Rongeur was used to remove some cancellous bone to get  into the lateral portion of the proximal femur for placement of the  initial starter reamer. The starter broaches was placed  the starter broach  and was shown to go down the center of the canal. Broaching  with the  Corail system was then performed starting at size 8, coursing  Up to size 11. A size 11 had excellent torsional and rotational  and axial stability. The trial high offset neck was then  placed  with a 32 + 1 trial head. The hip was then reduced. We confirmed that  the stem was in the canal both on AP and lateral x-rays. It also has excellent sizing. The hip was reduced with outstanding stability through full extension and full external rotation.. AP pelvis was taken and the leg lengths were measured and found to be equal. Hip was then dislocated again and the femoral head and neck removed. The  femoral broach was removed. Size 11 Corail stem with a high offset  neck was then impacted into the femur following native anteversion. Has  excellent purchase in the canal. Excellent torsional and rotational and  axial stability. It is confirmed to be in the canal on AP and lateral  fluoroscopic views. The 32+ 1 ceramic head was placed and the hip  reduced with outstanding stability. Again AP pelvis was taken and it  confirmed that the leg lengths were equal. The wound was then copiously  irrigated with saline solution and the capsule reattached and repaired  with Ethibond suture. 30 ml of .25% Bupivicaine was  injected into the capsule and into the edge of the tensor fascia lata as well as subcutaneous tissue. The fascia overlying the tensor fascia lata was then closed with a running #1 V-Loc. Subcu was closed with interrupted 2-0 Vicryl and subcuticular running 4-0 Monocryl. Incision was cleaned  and dried. Steri-Strips and a bulky sterile dressing applied. Hemovac  drain was hooked to suction and then the patient was awakened and transported to  recovery in stable condition.        Please note that a surgical assistant was a medical necessity for this procedure to perform it in a safe and expeditious manner. Assistant was necessary to provide appropriate retraction of vital neurovascular structures and to prevent femoral fracture and allow for anatomic placement of the prosthesis.  Gaynelle Arabian, M.D.

## 2017-11-25 NOTE — Interval H&P Note (Signed)
History and Physical Interval Note:  11/25/2017 10:17 AM  Christina Clay  has presented today for surgery, with the diagnosis of Osteoarthritis Left hip  The various methods of treatment have been discussed with the patient and family. After consideration of risks, benefits and other options for treatment, the patient has consented to  Procedure(s): LEFT TOTAL HIP ARTHROPLASTY ANTERIOR APPROACH (Left) as a surgical intervention .  The patient's history has been reviewed, patient examined, no change in status, stable for surgery.  I have reviewed the patient's chart and labs.  Questions were answered to the patient's satisfaction.     Pilar Plate Weldon Nouri

## 2017-11-25 NOTE — Discharge Instructions (Addendum)
° °Dr. Frank Aluisio °Total Joint Specialist °[facility] °[address] °[place], [place] [address] °[phone] ° °ANTERIOR APPROACH TOTAL HIP REPLACEMENT POSTOPERATIVE DIRECTIONS ° ° °Hip Rehabilitation, Guidelines Following Surgery  °The results of a hip operation are greatly improved after range of motion and muscle strengthening exercises. Follow all safety measures which are given to protect your hip. If any of these exercises cause increased pain or swelling in your joint, decrease the amount until you are comfortable again. Then slowly increase the exercises. Call your caregiver if you have problems or questions.  ° °HOME CARE INSTRUCTIONS  °Remove items at home which could result in a fall. This includes throw rugs or furniture in walking pathways.  °· ICE to the affected hip every three hours for 30 minutes at a time and then as needed for pain and swelling.  Continue to use ice on the hip for pain and swelling from surgery. You may notice swelling that will progress down to the foot and ankle.  This is normal after surgery.  Elevate the leg when you are not up walking on it.   °· Continue to use the breathing machine which will help keep your temperature down.  It is common for your temperature to cycle up and down following surgery, especially at night when you are not up moving around and exerting yourself.  The breathing machine keeps your lungs expanded and your temperature down. ° ° °DIET °You may resume your previous home diet once your are discharged from the hospital. ° °DRESSING / WOUND CARE / SHOWERING °You may change your dressing every day with sterile gauze.  Please use good hand washing techniques before changing the dressing.  Do not use any lotions or creams on the incision until instructed by your surgeon. °You may start showering once you are discharged home but do not submerge the incision under water. Just pat the incision dry and apply a dry gauze dressing on  daily. °Change the surgical dressing daily and reapply a dry dressing each time. ° °ACTIVITY °Walk with your walker as instructed. °Use walker as long as suggested by your caregivers. °Avoid periods of inactivity such as sitting longer than an hour when not asleep. This helps prevent blood clots.  °You may resume a sexual relationship in one month or when given the OK by your doctor.  °You may return to work once you are cleared by your doctor.  °Do not drive a car for 6 weeks or until released by you surgeon.  °Do not drive while taking narcotics. ° °WEIGHT BEARING °Weight bearing as tolerated with assist device (walker, cane, etc) as directed, use it as long as suggested by your surgeon or therapist, typically at least 4-6 weeks. ° °POSTOPERATIVE CONSTIPATION PROTOCOL °Constipation - defined medically as fewer than three stools per week and severe constipation as less than one stool per week. ° °One of the most common issues patients have following surgery is constipation.  Even if you have a regular bowel pattern at home, your normal regimen is likely to be disrupted due to multiple reasons following surgery.  Combination of anesthesia, postoperative narcotics, change in appetite and fluid intake all can affect your bowels.  In order to avoid complications following surgery, here are some recommendations in order to help you during your recovery period. ° °Colace (docusate) - Pick up an over-the-counter form of Colace or another stool softener and take twice a day as long as you are requiring postoperative pain   medications.  Take with a full glass of water daily.  If you experience loose stools or diarrhea, hold the colace until you stool forms back up.  If your symptoms do not get better within 1 week or if they get worse, check with your doctor. ° °Dulcolax (bisacodyl) - Pick up over-the-counter and take as directed by the product packaging as needed to assist with the movement of your bowels.  Take with a full  glass of water.  Use this product as needed if not relieved by Colace only.  ° °MiraLax (polyethylene glycol) - Pick up over-the-counter to have on hand.  MiraLax is a solution that will increase the amount of water in your bowels to assist with bowel movements.  Take as directed and can mix with a glass of water, juice, soda, coffee, or tea.  Take if you go more than two days without a movement. °Do not use MiraLax more than once per day. Call your doctor if you are still constipated or irregular after using this medication for 7 days in a row. ° °If you continue to have problems with postoperative constipation, please contact the office for further assistance and recommendations.  If you experience "the worst abdominal pain ever" or develop nausea or vomiting, please contact the office immediatly for further recommendations for treatment. ° °ITCHING ° If you experience itching with your medications, try taking only a single pain pill, or even half a pain pill at a time.  You can also use Benadryl over the counter for itching or also to help with sleep.  ° °TED HOSE STOCKINGS °Wear the elastic stockings on both legs for three weeks following surgery during the day but you may remove then at night for sleeping. ° °MEDICATIONS °See your medication summary on the “After Visit Summary” that the nursing staff will review with you prior to discharge.  You may have some home medications which will be placed on hold until you complete the course of blood thinner medication.  It is important for you to complete the blood thinner medication as prescribed by your surgeon.  Continue your approved medications as instructed at time of discharge. ° °PRECAUTIONS °If you experience chest pain or shortness of breath - call 911 immediately for transfer to the hospital emergency department.  °If you develop a fever greater that 101 F, purulent drainage from wound, increased redness or drainage from wound, foul odor from the  wound/dressing, or calf pain - CONTACT YOUR SURGEON.   °                                                °FOLLOW-UP APPOINTMENTS °Make sure you keep all of your appointments after your operation with your surgeon and caregivers. You should call the office at the above phone number and make an appointment for approximately two weeks after the date of your surgery or on the date instructed by your surgeon outlined in the "After Visit Summary". ° °RANGE OF MOTION AND STRENGTHENING EXERCISES  °These exercises are designed to help you keep full movement of your hip joint. Follow your caregiver's or physical therapist's instructions. Perform all exercises about fifteen times, three times per day or as directed. Exercise both hips, even if you have had only one joint replacement. These exercises can be done on a training (exercise) mat, on the floor, on   a table or on a bed. Use whatever works the best and is most comfortable for you. Use music or television while you are exercising so that the exercises are a pleasant break in your day. This will make your life better with the exercises acting as a break in routine you can look forward to.  Lying on your back, slowly slide your foot toward your buttocks, raising your knee up off the floor. Then slowly slide your foot back down until your leg is straight again.  Lying on your back spread your legs as far apart as you can without causing discomfort.  Lying on your side, raise your upper leg and foot straight up from the floor as far as is comfortable. Slowly lower the leg and repeat.  Lying on your back, tighten up the muscle in the front of your thigh (quadriceps muscles). You can do this by keeping your leg straight and trying to raise your heel off the floor. This helps strengthen the largest muscle supporting your knee.  Lying on your back, tighten up the muscles of your buttocks both with the legs straight and with the knee bent at a comfortable angle while keeping  your heel on the floor.   IF YOU ARE TRANSFERRED TO A SKILLED REHAB FACILITY If the patient is transferred to a skilled rehab facility following release from the hospital, a list of the current medications will be sent to the facility for the patient to continue.  When discharged from the skilled rehab facility, please have the facility set up the patient's [place] prior to being released. Also, the skilled facility will be responsible for providing the patient with their medications at time of release from the facility to include their pain medication, the muscle relaxants, and their blood thinner medication. If the patient is still at the rehab facility at time of the two week follow up appointment, the skilled rehab facility will also need to assist the patient in arranging follow up appointment in our office and any transportation needs.  MAKE SURE YOU:  Understand these instructions.  Get help right away if you are not doing well or get worse.    Pick up stool softner and laxative for home use following surgery while on pain medications. Do not submerge incision under water. Please use good hand washing techniques while changing dressing each day. May shower starting three days after surgery. Please use a clean towel to pat the incision dry following showers. Continue to use ice for pain and swelling after surgery. Do not use any lotions or creams on the incision until instructed by your surgeon.   Information on my medicine - XARELTO (Rivaroxaban)  This medication education was reviewed with me or my healthcare representative as part of my discharge preparation.    Why was Xarelto prescribed for you? Xarelto was prescribed for you to reduce the risk of blood clots forming after orthopedic surgery. The medical term for these abnormal blood clots is venous thromboembolism (VTE).  What do you need to know about xarelto ? Take your Xarelto ONCE DAILY at the same  time every day. You may take it either with or without food.  If you have difficulty swallowing the tablet whole, you may crush it and mix in applesauce just prior to taking your dose.  Take Xarelto exactly as prescribed by your doctor and DO NOT stop taking Xarelto without talking to the doctor who prescribed the medication.  Stopping without other VTE  Stopping without other VTE prevention medication to take the place of Xarelto® may increase your risk of developing a clot. ° °After discharge, you should have regular check-up appointments with your healthcare provider that is prescribing your Xarelto®.   ° °What do you do if you miss a dose? °If you miss a dose, take it as soon as you remember on the same day then continue your regularly scheduled once daily regimen the next day. Do not take two doses of Xarelto® on the same day.  ° °Important Safety Information °A possible side effect of Xarelto® is bleeding. You should call your healthcare provider right away if you experience any of the following: °? Bleeding from an injury or your nose that does not stop. °? Unusual colored urine (red or dark brown) or unusual colored stools (red or black). °? Unusual bruising for unknown reasons. °? A serious fall or if you hit your head (even if there is no bleeding). ° °Some medicines may interact with Xarelto® and might increase your risk of bleeding while on Xarelto®. To help avoid this, consult your healthcare provider or pharmacist prior to using any new prescription or non-prescription medications, including herbals, vitamins, non-steroidal anti-inflammatory drugs (NSAIDs) and supplements. ° °This website has more information on Xarelto®: [email or web address].com. ° ° ° °

## 2017-11-26 LAB — BASIC METABOLIC PANEL
ANION GAP: 8 (ref 5–15)
BUN: 13 mg/dL (ref 6–20)
CO2: 25 mmol/L (ref 22–32)
Calcium: 8.6 mg/dL — ABNORMAL LOW (ref 8.9–10.3)
Chloride: 105 mmol/L (ref 101–111)
Creatinine, Ser: 0.67 mg/dL (ref 0.44–1.00)
GFR calc Af Amer: >60 mL/min (ref >60)
Glucose, Bld: 128 mg/dL — ABNORMAL HIGH (ref 65–99)
POTASSIUM: 4.2 mmol/L (ref 3.5–5.1)
SODIUM: 138 mmol/L (ref 135–145)

## 2017-11-26 LAB — CBC
HCT: 36.1 % (ref 36.0–46.0)
Hemoglobin: 11.6 g/dL — ABNORMAL LOW (ref 12.0–15.0)
MCH: 28.7 pg (ref 26.0–34.0)
MCHC: 32.1 g/dL (ref 30.0–36.0)
MCV: 89.4 fL (ref 78.0–100.0)
PLATELETS: 257 10*3/uL (ref 150–400)
RBC: 4.04 MIL/uL (ref 3.87–5.11)
RDW: 12.6 % (ref 11.5–15.5)
WBC: 9.7 10*3/uL (ref 4.0–10.5)

## 2017-11-26 MED ORDER — TRAMADOL HCL 50 MG PO TABS: 50.0000 mg | ORAL_TABLET | Freq: Four times a day (QID) | ORAL | 0 refills | Status: DC | PRN

## 2017-11-26 MED ORDER — OXYCODONE HCL 5 MG PO TABS: 5.0000 mg | ORAL_TABLET | ORAL | 0 refills | Status: DC | PRN

## 2017-11-26 NOTE — Discharge Summary (Signed)
Physician Discharge Summary   Patient ID: Christina Clay MRN: [id number] DOB/AGE: 1951/01/03 67 y.o.  Admit date: 11/25/2017 Discharge date: 11/26/2017  Primary Diagnosis: Primary osteoarthritis left hip  Admission Diagnoses:  Past Medical History:  Diagnosis Date  . Arthritis    OA  . DJD (degenerative joint disease)    LOWER BACK  . Hepatitis   . HTN (hypertension)   . Hypercholesterolemia   . Pulmonary embolism ([place]) 03/2013    2 DAYS AFTER AFTER RIGHT HIP REPLACEMENT SURGERY TOOK XARELTO FOR A FEW MONTHS SURGERY WAS CAUSE   Discharge Diagnoses:   Principal Problem:   OA (osteoarthritis) of hip  Estimated body mass index is 30.38 kg/m as calculated from the following:   Height as of this encounter: '5\' 4"'  (1.626 m).   Weight as of this encounter: 80.3 kg (177 lb).  Procedure(s) (LRB): LEFT TOTAL HIP ARTHROPLASTY ANTERIOR APPROACH (Left)   Consults: None  HPI: Melodye Ped, 67 y.o. female, has a history of pain and functional disability in the left hip(s) due to arthritis and patient has failed non-surgical conservative treatments for greater than 12 weeks to include NSAID's and/or analgesics, corticosteriod injections, flexibility and strengthening excercises and activity modification.  Onset of symptoms was gradual starting 1 year ago with rapidlly worsening course since that time.The patient noted no past surgery on the left hip(s).  Patient currently rates pain in the left hip at 8 out of 10 with activity. Patient has night pain, worsening of pain with activity and weight bearing, pain that interfers with activities of daily living and pain with passive range of motion. Patient has evidence of subchondral cysts, periarticular osteophytes and joint space narrowing by imaging studies. This condition presents safety issues increasing the risk of falls. There is no current active infection.    Laboratory Data: Admission on 11/25/2017, Discharged on 11/26/2017    Component Date Value Ref Range Status  . WBC 11/26/2017 9.7  4.0 - 10.5 K/uL Final  . RBC 11/26/2017 4.04  3.87 - 5.11 MIL/uL Final  . Hemoglobin 11/26/2017 11.6* 12.0 - 15.0 g/dL Final  . HCT 11/26/2017 36.1  36.0 - 46.0 % Final  . MCV 11/26/2017 89.4  78.0 - 100.0 fL Final  . MCH 11/26/2017 28.7  26.0 - 34.0 pg Final  . MCHC 11/26/2017 32.1  30.0 - 36.0 g/dL Final  . RDW 11/26/2017 12.6  11.5 - 15.5 % Final  . Platelets 11/26/2017 257  150 - 400 K/uL Final   Performed at [facility], [place] [address] [place], [place] [address]  . Sodium 11/26/2017 138  135 - 145 mmol/L Final  . Potassium 11/26/2017 4.2  3.5 - 5.1 mmol/L Final  . Chloride 11/26/2017 105  101 - 111 mmol/L Final  . CO2 11/26/2017 25  22 - 32 mmol/L Final  . Glucose, Bld 11/26/2017 128* 65 - 99 mg/dL Final  . BUN 11/26/2017 13  6 - 20 mg/dL Final  . Creatinine, Ser 11/26/2017 0.67  0.44 - 1.00 mg/dL Final  . Calcium 11/26/2017 8.6* 8.9 - 10.3 mg/dL Final  . GFR calc non Af Amer 11/26/2017 >60  >60 mL/min Final  . GFR calc Af Amer 11/26/2017 >60  >60 mL/min Final   Comment: (NOTE) The eGFR has been calculated using the CKD EPI equation. This calculation has not been validated in all clinical situations. eGFR's persistently <60 mL/min signify possible Chronic Kidney Disease.   . Anion gap 11/26/2017 8  5 - 15 Final  Performed at [facility], [place] [address], [place], [place] [address]  Hospital Outpatient Visit on 11/20/2017  Component Date Value Ref Range Status  . aPTT 11/20/2017 24  24 - 36 seconds Final   Performed at [facility], [place] [address], [place], [place] [address]  . WBC 11/20/2017 6.4  4.0 - 10.5 K/uL Final  . RBC 11/20/2017 4.65  3.87 - 5.11 MIL/uL Final  . Hemoglobin 11/20/2017 13.4  12.0 - 15.0 g/dL Final  . HCT 11/20/2017 41.4  36.0 - 46.0 % Final  . MCV 11/20/2017 89.0  78.0 - 100.0 fL Final  . MCH 11/20/2017 28.8  26.0 - 34.0 pg Final   . MCHC 11/20/2017 32.4  30.0 - 36.0 g/dL Final  . RDW 11/20/2017 12.8  11.5 - 15.5 % Final  . Platelets 11/20/2017 276  150 - 400 K/uL Final   Performed at [facility], [place] [address] [place], [place] [address]  . Sodium 11/20/2017 140  135 - 145 mmol/L Final  . Potassium 11/20/2017 3.8  3.5 - 5.1 mmol/L Final  . Chloride 11/20/2017 107  101 - 111 mmol/L Final  . CO2 11/20/2017 24  22 - 32 mmol/L Final  . Glucose, Bld 11/20/2017 99  65 - 99 mg/dL Final  . BUN 11/20/2017 17  6 - 20 mg/dL Final  . Creatinine, Ser 11/20/2017 0.68  0.44 - 1.00 mg/dL Final  . Calcium 11/20/2017 9.0  8.9 - 10.3 mg/dL Final  . Total Protein 11/20/2017 6.7  6.5 - 8.1 g/dL Final  . Albumin 11/20/2017 4.0  3.5 - 5.0 g/dL Final  . AST 11/20/2017 19  15 - 41 U/L Final  . ALT 11/20/2017 12* 14 - 54 U/L Final  . Alkaline Phosphatase 11/20/2017 52  38 - 126 U/L Final  . Total Bilirubin 11/20/2017 0.4  0.3 - 1.2 mg/dL Final  . GFR calc non Af Amer 11/20/2017 >60  >60 mL/min Final  . GFR calc Af Amer 11/20/2017 >60  >60 mL/min Final   Comment: (NOTE) The eGFR has been calculated using the CKD EPI equation. This calculation has not been validated in all clinical situations. eGFR's persistently <60 mL/min signify possible Chronic Kidney Disease.   Georgiann Hahn gap 11/20/2017 9  5 - 15 Final   Performed at [facility], [place] [address] [place], [place] [address]  . Prothrombin Time 11/20/2017 12.4  11.4 - 15.2 seconds Final  . INR 11/20/2017 0.94   Final   Performed at [facility], [place] [address], [place], [place] [address]  . ABO/RH(D) 11/20/2017 A POS   Final  . Antibody Screen 11/20/2017 NEG   Final  . Sample Expiration 11/20/2017 11/28/2017   Final  . Extend sample reason 11/20/2017    Final                   Value:NO TRANSFUSIONS OR PREGNANCY IN THE PAST 3 MONTHS Performed at [facility], [place] [address] [place], [place] [address]   .  Color, Urine 11/20/2017 YELLOW  YELLOW Final  . APPearance 11/20/2017 HAZY* CLEAR Final  . Specific Gravity, Urine 11/20/2017 1.017  1.005 - 1.030 Final  . pH 11/20/2017 5.0  5.0 - 8.0 Final  . Glucose, UA 11/20/2017 NEGATIVE  NEGATIVE mg/dL Final  . Hgb urine dipstick 11/20/2017 NEGATIVE  NEGATIVE Final  . Bilirubin Urine 11/20/2017 NEGATIVE  NEGATIVE Final  . Ketones, ur 11/20/2017 5* NEGATIVE mg/dL Final  .  Protein, ur 11/20/2017 NEGATIVE  NEGATIVE mg/dL Final  . Nitrite 11/20/2017 NEGATIVE  NEGATIVE Final  . Leukocytes, UA 11/20/2017 LARGE* NEGATIVE Final  . RBC / HPF 11/20/2017 0-5  0 - 5 RBC/hpf Final  . WBC, UA 11/20/2017 11-20  0 - 5 WBC/hpf Final  . Bacteria, UA 11/20/2017 NONE SEEN  NONE SEEN Final  . Squamous Epithelial / LPF 11/20/2017 0-5  0 - 5 Final  . Mucus 11/20/2017 PRESENT   Final   Performed at [facility], [place] [address], [place], [place] [address]  . MRSA, PCR 11/20/2017 NEGATIVE  NEGATIVE Final  . Staphylococcus aureus 11/20/2017 NEGATIVE  NEGATIVE Final   Comment: (NOTE) The Xpert SA Assay (FDA approved for NASAL specimens in patients 56 years of age and older), is one component of a comprehensive surveillance program. It is not intended to diagnose infection nor to guide or monitor treatment. Performed at [facility], [place] [address] [place], [place] [address]   . ABO/RH(D) 11/20/2017    Final                   Value:A POS Performed at [facility], [place] [address], [place], [place] [address]      X-Rays:Dg Pelvis Portable  Result Date: 11/25/2017 CLINICAL DATA:  Status post left hip arthroplasty EXAM: PORTABLE PELVIS 1-2 VIEWS COMPARISON:  Intraoperative images obtained earlier in the day FINDINGS: Frontal view obtained. There are total hip replacements bilaterally with prosthetic components bilaterally well-seated. No acute fracture or dislocation. Soft tissue air and surgical drain on the left are  expected postoperative findings. IMPRESSION: Total hip replacements bilaterally with prosthetic components bilaterally well-seated. No acute fracture or dislocation. Electronically Signed   By: Lowella Grip III M.D.   On: 11/25/2017 13:21   Dg C-arm 1-60 Min-no Report  Result Date: 11/25/2017 Fluoroscopy was utilized by the requesting physician.  No radiographic interpretation.    EKG: Orders placed or performed during the hospital encounter of 11/20/17  . EKG 12 lead  . EKG 12 lead     Hospital Course: Patient was admitted to [facility] and taken to the OR and underwent the above state procedure without complications.  Patient tolerated the procedure well and was later transferred to the recovery room and then to the orthopaedic floor for postoperative care.  They were given PO and IV analgesics for pain control following their surgery.  They were given 24 hours of postoperative antibiotics of  Anti-infectives (From admission, onward)   Start     Dose/Rate Route Frequency Ordered Stop   11/25/17 1730  ceFAZolin (ANCEF) IVPB 2g/100 mL premix     2 g 200 mL/hr over 30 Minutes Intravenous Every 6 hours 11/25/17 1341 11/25/17 2341   11/25/17 1003  ceFAZolin (ANCEF) 2-4 GM/100ML-% IVPB    Note to Pharmacy:  Waldron Session   : cabinet override      11/25/17 1003 11/25/17 1125   11/25/17 1000  ceFAZolin (ANCEF) IVPB 2g/100 mL premix     2 g 200 mL/hr over 30 Minutes Intravenous On call to O.R. 11/25/17 4920 11/25/17 1125     and started on DVT prophylaxis in the form of Xarelto.   PT and OT were ordered for total hip protocol.  The patient was allowed to be WBAT with therapy. Discharge planning was consulted to help with postop disposition and equipment needs.  Patient had a good night on the evening of surgery.  They started  to get up OOB with therapy on day one. The patient had progressed with therapy and meeting their goals.  Incision was healing well.  Patient was seen in  rounds and was ready to go home.  Diet: Cardiac diet Activity:WBAT Follow-up:in 2 weeks Disposition - Home Discharged Condition: stable   Discharge Instructions    Call MD / Call 911   Complete by:  As directed    If you experience chest pain or shortness of breath, CALL 911 and be transported to the hospital emergency room.  If you develope a fever above 101 F, pus (white drainage) or increased drainage or redness at the wound, or calf pain, call your surgeon's office.   Constipation Prevention   Complete by:  As directed    Drink plenty of fluids.  Prune juice may be helpful.  You may use a stool softener, such as Colace (over the counter) 100 mg twice a day.  Use MiraLax (over the counter) for constipation as needed.   Diet - low sodium heart healthy   Complete by:  As directed    Discharge instructions   Complete by:  As directed    Dr. Gaynelle Arabian Total Joint Specialist [facility] [address], [place], [place] [address] [phone]  ANTERIOR APPROACH TOTAL HIP REPLACEMENT POSTOPERATIVE DIRECTIONS   Hip Rehabilitation, Guidelines Following Surgery  The results of a hip operation are greatly improved after range of motion and muscle strengthening exercises. Follow all safety measures which are given to protect your hip. If any of these exercises cause increased pain or swelling in your joint, decrease the amount until you are comfortable again. Then slowly increase the exercises. Call your caregiver if you have problems or questions.   HOME CARE INSTRUCTIONS  Remove items at home which could result in a fall. This includes throw rugs or furniture in walking pathways.  ICE to the affected hip every three hours for 30 minutes at a time and then as needed for pain and swelling.  Continue to use ice on the hip for pain and swelling from surgery. You may notice swelling that will progress down to the foot and ankle.  This is normal after surgery.  Elevate the leg  when you are not up walking on it.   Continue to use the breathing machine which will help keep your temperature down.  It is common for your temperature to cycle up and down following surgery, especially at night when you are not up moving around and exerting yourself.  The breathing machine keeps your lungs expanded and your temperature down.   DIET You may resume your previous home diet once your are discharged from the hospital.  DRESSING / WOUND CARE / SHOWERING You may change your dressing every day with sterile gauze.  Please use good hand washing techniques before changing the dressing.  Do not use any lotions or creams on the incision until instructed by your surgeon. You may start showering once you are discharged home but do not submerge the incision under water. Just pat the incision dry and apply a dry gauze dressing on daily. Change the surgical dressing daily and reapply a dry dressing each time.  ACTIVITY Walk with your walker as instructed. Use walker as long as suggested by your caregivers. Avoid periods of inactivity such as sitting longer than an hour when not asleep. This helps prevent blood clots.  You may resume a sexual relationship in one month or when given the [facility]  by your doctor.  You may return to work once you are cleared by your doctor.  Do not drive a car for 6 weeks or until released by you surgeon.  Do not drive while taking narcotics.  WEIGHT BEARING Weight bearing as tolerated with assist device (walker, cane, etc) as directed, use it as long as suggested by your surgeon or therapist, typically at least 4-6 weeks.  POSTOPERATIVE CONSTIPATION PROTOCOL Constipation - defined medically as fewer than three stools per week and severe constipation as less than one stool per week.  One of the most common issues patients have following surgery is constipation.  Even if you have a regular bowel pattern at home, your normal regimen is likely to be disrupted due to  multiple reasons following surgery.  Combination of anesthesia, postoperative narcotics, change in appetite and fluid intake all can affect your bowels.  In order to avoid complications following surgery, here are some recommendations in order to help you during your recovery period.  Colace (docusate) - Pick up an over-the-counter form of Colace or another stool softener and take twice a day as long as you are requiring postoperative pain medications.  Take with a full glass of water daily.  If you experience loose stools or diarrhea, hold the colace until you stool forms back up.  If your symptoms do not get better within 1 week or if they get worse, check with your doctor.  Dulcolax (bisacodyl) - Pick up over-the-counter and take as directed by the product packaging as needed to assist with the movement of your bowels.  Take with a full glass of water.  Use this product as needed if not relieved by Colace only.   MiraLax (polyethylene glycol) - Pick up over-the-counter to have on hand.  MiraLax is a solution that will increase the amount of water in your bowels to assist with bowel movements.  Take as directed and can mix with a glass of water, juice, soda, coffee, or tea.  Take if you go more than two days without a movement. Do not use MiraLax more than once per day. Call your doctor if you are still constipated or irregular after using this medication for 7 days in a row.  If you continue to have problems with postoperative constipation, please contact the office for further assistance and recommendations.  If you experience "the worst abdominal pain ever" or develop nausea or vomiting, please contact the office immediatly for further recommendations for treatment.  ITCHING  If you experience itching with your medications, try taking only a single pain pill, or even half a pain pill at a time.  You can also use Benadryl over the counter for itching or also to help with sleep.   TED HOSE  STOCKINGS Wear the elastic stockings on both legs for three weeks following surgery during the day but you may remove then at night for sleeping.  MEDICATIONS See your medication summary on the "After Visit Summary" that the nursing staff will review with you prior to discharge.  You may have some home medications which will be placed on hold until you complete the course of blood thinner medication.  It is important for you to complete the blood thinner medication as prescribed by your surgeon.  Continue your approved medications as instructed at time of discharge.  PRECAUTIONS If you experience chest pain or shortness of breath - call 911 immediately for transfer to the hospital emergency department.  If you develop a fever greater that  101 F, purulent drainage from wound, increased redness or drainage from wound, foul odor from the wound/dressing, or calf pain - CONTACT YOUR SURGEON.                                                   FOLLOW-UP APPOINTMENTS Make sure you keep all of your appointments after your operation with your surgeon and caregivers. You should call the office at the above phone number and make an appointment for approximately two weeks after the date of your surgery or on the date instructed by your surgeon outlined in the "After Visit Summary".  RANGE OF MOTION AND STRENGTHENING EXERCISES  These exercises are designed to help you keep full movement of your hip joint. Follow your caregiver's or physical therapist's instructions. Perform all exercises about fifteen times, three times per day or as directed. Exercise both hips, even if you have had only one joint replacement. These exercises can be done on a training (exercise) mat, on the floor, on a table or on a bed. Use whatever works the best and is most comfortable for you. Use music or television while you are exercising so that the exercises are a pleasant break in your day. This will make your life better with the  exercises acting as a break in routine you can look forward to.  Lying on your back, slowly slide your foot toward your buttocks, raising your knee up off the floor. Then slowly slide your foot back down until your leg is straight again.  Lying on your back spread your legs as far apart as you can without causing discomfort.  Lying on your side, raise your upper leg and foot straight up from the floor as far as is comfortable. Slowly lower the leg and repeat.  Lying on your back, tighten up the muscle in the front of your thigh (quadriceps muscles). You can do this by keeping your leg straight and trying to raise your heel off the floor. This helps strengthen the largest muscle supporting your knee.  Lying on your back, tighten up the muscles of your buttocks both with the legs straight and with the knee bent at a comfortable angle while keeping your heel on the floor.   IF YOU ARE TRANSFERRED TO A SKILLED REHAB FACILITY If the patient is transferred to a skilled rehab facility following release from the hospital, a list of the current medications will be sent to the facility for the patient to continue.  When discharged from the skilled rehab facility, please have the facility set up the patient's [place] prior to being released. Also, the skilled facility will be responsible for providing the patient with their medications at time of release from the facility to include their pain medication, the muscle relaxants, and their blood thinner medication. If the patient is still at the rehab facility at time of the two week follow up appointment, the skilled rehab facility will also need to assist the patient in arranging follow up appointment in our office and any transportation needs.  MAKE SURE YOU:  Understand these instructions.  Get help right away if you are not doing well or get worse.    Pick up stool softner and laxative for home use following surgery while on pain  medications. Do not submerge incision under water. Please use good  hand washing techniques while changing dressing each day. May shower starting three days after surgery. Please use a clean towel to pat the incision dry following showers. Continue to use ice for pain and swelling after surgery. Do not use any lotions or creams on the incision until instructed by your surgeon.   Increase activity slowly as tolerated   Complete by:  As directed      Allergies as of 11/26/2017      Reactions   Naproxen Sodium Hives, Shortness Of Breath, Itching, Rash      Medication List    STOP taking these medications   aspirin 81 MG tablet   celecoxib 200 MG capsule Commonly known as:  CELEBREX   ibuprofen 800 MG tablet Commonly known as:  ADVIL,MOTRIN     TAKE these medications   amLODipine 2.5 MG tablet Commonly known as:  NORVASC Take 2.5 mg by mouth daily.   amoxicillin 500 MG capsule Commonly known as:  AMOXIL Take 2,000 mg by mouth See admin instructions. Take 4 capsules (2000 mg) by mouth 1 hour prior to dental appointments.   Eszopiclone 3 MG Tabs Take 3 mg by mouth at bedtime. Take immediately before bedtime   losartan 25 MG tablet Commonly known as:  COZAAR Take 25 mg by mouth daily.   methocarbamol 500 MG tablet Commonly known as:  ROBAXIN Take 1 tablet (500 mg total) by mouth every 6 (six) hours as needed for muscle spasms.   oxyCODONE 5 MG immediate release tablet Commonly known as:  Oxy IR/ROXICODONE Take 1-2 tablets (5-10 mg total) by mouth every 4 (four) hours as needed for moderate pain or severe pain.   pravastatin 40 MG tablet Commonly known as:  PRAVACHOL Take 20 mg by mouth daily.   rivaroxaban 10 MG Tabs tablet Commonly known as:  XARELTO Take 1 tablet (10 mg total) by mouth daily with breakfast.   traMADol 50 MG tablet Commonly known as:  ULTRAM Take 1 tablet (50 mg total) by mouth every 6 (six) hours as needed for moderate pain.      Follow-up  Information    Gaynelle Arabian, MD Follow up on 12/08/2017.   Specialty:  Orthopedic Surgery Contact information: [address] [place] [address] [phone]           Signed: Ardeen Jourdain, PA-C Orthopaedic Surgery 11/26/2017, 3:23 PM

## 2017-11-26 NOTE — Evaluation (Signed)
Physical Therapy Evaluation Patient Details Name: Christina Clay MRN: [id number] DOB: 02-25-1951 Today's Date: 11/26/2017   History of Present Illness  Pt s/p L THR and with hx of R THR - posterior  Clinical Impression  Pt s/p L THR and presents with decreased L LE strength/ROM and post op pain limiting functional mobility.  Pt should progress to dc home with family assist.    Follow Up Recommendations Follow surgeon's recommendation for DC plan and follow-up therapies    Equipment Recommendations  None recommended by PT    Recommendations for Other Services       Precautions / Restrictions Precautions Precautions: Fall Restrictions Weight Bearing Restrictions: No Other Position/Activity Restrictions: WBAT      Mobility  Bed Mobility Overal bed mobility: Needs Assistance Bed Mobility: Supine to Sit     Supine to sit: Min assist     General bed mobility comments: cues for sequence and use of R LE to self assist  Transfers Overall transfer level: Needs assistance Equipment used: Rolling walker (2 wheeled) Transfers: Sit to/from Stand Sit to Stand: Min assist         General transfer comment: cues for LE management and use of UEs to self assist  Ambulation/Gait Ambulation/Gait assistance: Min assist Ambulation Distance (Feet): 100 Feet Assistive device: Rolling walker (2 wheeled) Gait Pattern/deviations: Step-to pattern;Step-through pattern;Decreased step length - right;Decreased step length - left;Shuffle;Trunk flexed Gait velocity: decr   General Gait Details: cues for posture, position from RW and initial sequence  Stairs            Wheelchair Mobility    Modified Rankin (Stroke Patients Only)       Balance                                             Pertinent Vitals/Pain Pain Assessment: 0-10 Pain Score: 5  Pain Location: L hip Pain Descriptors / Indicators: Aching;Sore Pain Intervention(s): Limited activity  within patient's tolerance;Monitored during session;Premedicated before session;Ice applied    Home Living Family/patient expects to be discharged to:: Private residence Living Arrangements: Spouse/significant other Available Help at Discharge: Family Type of Home: House Home Access: Stairs to enter Entrance Stairs-Rails: Psychiatric nurse of Steps: 5 Home Layout: Two level Home Equipment: Environmental consultant - 2 wheels;Bedside commode;Cane - single point      Prior Function Level of Independence: Independent;Independent with assistive device(s)               Hand Dominance        Extremity/Trunk Assessment   Upper Extremity Assessment Upper Extremity Assessment: Overall WFL for tasks assessed    Lower Extremity Assessment Lower Extremity Assessment: LLE deficits/detail LLE Deficits / Details: 2+/5 strength at hip with AAROM at hip to 80 flex and 20 abd    Cervical / Trunk Assessment Cervical / Trunk Assessment: Normal  Communication   Communication: No difficulties  Cognition Arousal/Alertness: Awake/alert Behavior During Therapy: WFL for tasks assessed/performed Overall Cognitive Status: Within Functional Limits for tasks assessed                                        General Comments      Exercises Total Joint Exercises Ankle Circles/Pumps: AROM;Both;20 reps;Supine Quad Sets: AROM;Both;10 reps;Supine Heel Slides: AAROM;Left;20 reps;Supine Hip  ABduction/ADduction: AAROM;Left;15 reps;Supine   Assessment/Plan    PT Assessment Patient needs continued PT services  PT Problem List Decreased strength;Decreased range of motion;Decreased activity tolerance;Decreased mobility;Pain;Decreased knowledge of use of DME       PT Treatment Interventions DME instruction;Gait training;Stair training;Functional mobility training;Therapeutic activities;Therapeutic exercise;Patient/family education    PT Goals (Current goals can be found in the Care  Plan section)  Acute Rehab PT Goals Patient Stated Goal: Regain IND  PT Goal Formulation: With patient Time For Goal Achievement: 12/03/17 Potential to Achieve Goals: Good    Frequency 7X/week   Barriers to discharge        Co-evaluation               AM-PAC PT "6 Clicks" Daily Activity  Outcome Measure Difficulty turning over in bed (including adjusting bedclothes, sheets and blankets)?: Unable Difficulty moving from lying on back to sitting on the side of the bed? : Unable Difficulty sitting down on and standing up from a chair with arms (e.g., wheelchair, bedside commode, etc,.)?: Unable Help needed moving to and from a bed to chair (including a wheelchair)?: A Little Help needed walking in hospital room?: A Little Help needed climbing 3-5 steps with a railing? : A Little 6 Click Score: 12    End of Session Equipment Utilized During Treatment: Gait belt Activity Tolerance: Patient tolerated treatment well Patient left: in chair;with call bell/phone within reach;with family/visitor present Nurse Communication: Mobility status PT Visit Diagnosis: Difficulty in walking, not elsewhere classified (R26.2)    Time: 9892-1194 PT Time Calculation (min) (ACUTE ONLY): 35 min   Charges:   PT Evaluation $PT Eval Low Complexity: 1 Low PT Treatments $Therapeutic Exercise: 8-22 mins   PT G Codes:        Pg [phone]   Kyian Obst 11/26/2017, 12:46 PM

## 2017-11-26 NOTE — Progress Notes (Signed)
Physical Therapy Treatment Patient Details Name: Christina Clay MRN: [id number] DOB: December 18, 1950 Today's Date: 11/26/2017    History of Present Illness Pt s/p L THR and with hx of R THR - posterior    PT Comments    Pt requiring increased time but progressing steadily with mobility.  Reviewed car transfers and stairs with written instruction provided.  Follow Up Recommendations  Follow surgeon's recommendation for DC plan and follow-up therapies     Equipment Recommendations  None recommended by PT    Recommendations for Other Services       Precautions / Restrictions Precautions Precautions: Fall Restrictions Weight Bearing Restrictions: No Other Position/Activity Restrictions: WBAT    Mobility  Bed Mobility Overal bed mobility: Needs Assistance Bed Mobility: Sit to Supine     Supine to sit: Min assist Sit to supine: Min guard   General bed mobility comments: cues for sequence and use of R LE to self assist; pt used belt to self assist L LE onto bed  Transfers Overall transfer level: Needs assistance Equipment used: Rolling walker (2 wheeled) Transfers: Sit to/from Stand Sit to Stand: Min guard;Supervision         General transfer comment: cues for LE management and use of UEs to self assist  Ambulation/Gait Ambulation/Gait assistance: Min guard;Supervision Ambulation Distance (Feet): 110 Feet Assistive device: Rolling walker (2 wheeled) Gait Pattern/deviations: Step-to pattern;Step-through pattern;Decreased step length - right;Decreased step length - left;Shuffle;Trunk flexed Gait velocity: decr   General Gait Details: cues for posture, position from RW and initial sequence   Stairs Stairs: Yes Stairs assistance: Min assist Stair Management: One rail Right;Step to pattern;Forwards;With cane Number of Stairs: 5 General stair comments: 2+3 steps with cues for sequence and foot/cane placement   Wheelchair Mobility    Modified Rankin (Stroke  Patients Only)       Balance                                            Cognition Arousal/Alertness: Awake/alert Behavior During Therapy: WFL for tasks assessed/performed Overall Cognitive Status: Within Functional Limits for tasks assessed                                        Exercises Total Joint Exercises Ankle Circles/Pumps: AROM;Both;20 reps;Supine Quad Sets: AROM;Both;10 reps;Supine Heel Slides: AAROM;Left;20 reps;Supine Hip ABduction/ADduction: AAROM;Left;15 reps;Supine    General Comments        Pertinent Vitals/Pain Pain Assessment: 0-10 Pain Score: 5  Pain Location: L hip Pain Descriptors / Indicators: Aching;Sore Pain Intervention(s): Limited activity within patient's tolerance;Monitored during session;Premedicated before session;Ice applied    Home Living Family/patient expects to be discharged to:: Private residence Living Arrangements: Spouse/significant other Available Help at Discharge: Family Type of Home: House Home Access: Stairs to enter Entrance Stairs-Rails: Right;Left Home Layout: Two level Home Equipment: Environmental consultant - 2 wheels;Bedside commode;Cane - single point      Prior Function Level of Independence: Independent;Independent with assistive device(s)          PT Goals (current goals can now be found in the care plan section) Acute Rehab PT Goals Patient Stated Goal: Regain IND  PT Goal Formulation: With patient Time For Goal Achievement: 12/03/17 Potential to Achieve Goals: Good Progress towards PT goals: Progressing toward goals  Frequency    7X/week      PT Plan Current plan remains appropriate    Co-evaluation              AM-PAC PT "6 Clicks" Daily Activity  Outcome Measure  Difficulty turning over in bed (including adjusting bedclothes, sheets and blankets)?: A Lot Difficulty moving from lying on back to sitting on the side of the bed? : A Lot Difficulty sitting down on and  standing up from a chair with arms (e.g., wheelchair, bedside commode, etc,.)?: A Lot Help needed moving to and from a bed to chair (including a wheelchair)?: A Little Help needed walking in hospital room?: A Little Help needed climbing 3-5 steps with a railing? : A Little 6 Click Score: 15    End of Session Equipment Utilized During Treatment: Gait belt Activity Tolerance: Patient tolerated treatment well Patient left: in bed;with call bell/phone within reach Nurse Communication: Mobility status PT Visit Diagnosis: Difficulty in walking, not elsewhere classified (R26.2)     Time: 2707-8675 PT Time Calculation (min) (ACUTE ONLY): 29 min  Charges:  $Gait Training: 8-22 mins $Therapeutic Exercise: 8-22 mins $Therapeutic Activity: 8-22 mins                    G Codes:       Pg [phone]    Murrell Elizondo 11/26/2017, 3:29 PM

## 2017-11-26 NOTE — Progress Notes (Signed)
   Subjective: 1 Day Post-Op Procedure(s) (LRB): LEFT TOTAL HIP ARTHROPLASTY ANTERIOR APPROACH (Left) Patient reports pain as moderate.   Patient seen in rounds with Dr. Wynelle Link. Patient is well, and has had no acute complaints or problems. Voiding and positive flatus. We will start therapy today.  Plan is to go Home after hospital stay.  Objective: Vital signs in last 24 hours: Temp:  [97.3 F (36.3 C)-98.6 F (37 C)] 98.6 F (37 C) (05/16 0609) Pulse Rate:  [59-78] 60 (05/16 0609) Resp:  [14-20] 18 (05/16 0609) BP: (103-148)/(47-75) 103/48 (05/16 0609) SpO2:  [94 %-100 %] 94 % (05/16 0609) Weight:  [80.3 kg (177 lb)-80.6 kg (177 lb 12.8 oz)] 80.3 kg (177 lb) (05/15 1330)  Intake/Output from previous day:  Intake/Output Summary (Last 24 hours) at 11/26/2017 0721 Last data filed at 11/26/2017 0600 Gross per 24 hour  Intake 2980 ml  Output 2145 ml  Net 835 ml    Labs: Recent Labs    11/26/17 0551  HGB 11.6*   Recent Labs    11/26/17 0551  WBC 9.7  RBC 4.04  HCT 36.1  PLT 257   Recent Labs    11/26/17 0551  NA 138  K 4.2  CL 105  CO2 25  BUN 13  CREATININE 0.67  GLUCOSE 128*  CALCIUM 8.6*   EXAM General - Patient is Alert, Appropriate and Oriented Extremity - Neurovascular intact Sensation intact distally Intact pulses distally Dressing - dressing C/D/I Motor Function - intact, moving foot and toes well on exam.  Hemovac pulled without difficulty.  Past Medical History:  Diagnosis Date  . Arthritis    OA  . DJD (degenerative joint disease)    LOWER BACK  . Hepatitis   . HTN (hypertension)   . Hypercholesterolemia   . Pulmonary embolism ([place]) 03/2013    2 DAYS AFTER AFTER RIGHT HIP REPLACEMENT SURGERY TOOK XARELTO FOR A FEW MONTHS SURGERY WAS CAUSE    Assessment/Plan: 1 Day Post-Op Procedure(s) (LRB): LEFT TOTAL HIP ARTHROPLASTY ANTERIOR APPROACH (Left) Principal Problem:   OA (osteoarthritis) of hip  Estimated body mass index is 30.38  kg/m as calculated from the following:   Height as of this encounter: 5\' 4"  (1.626 m).   Weight as of this encounter: 80.3 kg (177 lb). Advance diet Up with therapy  DVT Prophylaxis - Xarelto Weight Bearing As Tolerated Hemovac Pulled Begin therapy today, possible discharge later this afternoon with HEP if progresses with therapy and meeting goals.  Ardeen Jourdain, PA-C Orthopaedic Surgery 11/26/2017, 7:21 AM

## 2017-11-26 NOTE — Progress Notes (Signed)
Spoke with patient at bedside. Confirmed plan for HEP, already arranged. Has RW and 3n1. [phone] 

## 2017-11-26 NOTE — Progress Notes (Signed)
Physical Therapy Treatment Patient Details Name: ARMONEE BOJANOWSKI MRN: [id number] DOB: 23-Oct-1950 Today's Date: 11/26/2017    History of Present Illness Pt s/p L THR and with hx of R THR - posterior    PT Comments    Reviewed home therex program with pt and spouse with written instruction provided and progression of therex included.   Follow Up Recommendations  Follow surgeon's recommendation for DC plan and follow-up therapies     Equipment Recommendations  None recommended by PT    Recommendations for Other Services       Precautions / Restrictions Precautions Precautions: Fall Restrictions Weight Bearing Restrictions: No Other Position/Activity Restrictions: WBAT    Mobility  Bed Mobility Overal bed mobility: Needs Assistance Bed Mobility: Supine to Sit;Sit to Supine     Supine to sit: Min guard Sit to supine: Min guard   General bed mobility comments: cues for sequence and use of R LE to self assist; pt used belt to self assist L LE   Transfers Overall transfer level: Needs assistance Equipment used: Rolling walker (2 wheeled) Transfers: Sit to/from Stand Sit to Stand: Min guard;Supervision         General transfer comment: cues for LE management and use of UEs to self assist  Ambulation/Gait Ambulation/Gait assistance: Min guard;Supervision Ambulation Distance (Feet): 110 Feet Assistive device: Rolling walker (2 wheeled) Gait Pattern/deviations: Step-to pattern;Step-through pattern;Decreased step length - right;Decreased step length - left;Shuffle;Trunk flexed Gait velocity: decr   General Gait Details: cues for posture, position from RW and initial sequence   Stairs Stairs: Yes Stairs assistance: Min assist Stair Management: One rail Right;Step to pattern;Forwards;With cane Number of Stairs: 5 General stair comments: 2+3 steps with cues for sequence and foot/cane placement   Wheelchair Mobility    Modified Rankin (Stroke Patients Only)        Balance                                            Cognition Arousal/Alertness: Awake/alert Behavior During Therapy: WFL for tasks assessed/performed Overall Cognitive Status: Within Functional Limits for tasks assessed                                        Exercises Total Joint Exercises Ankle Circles/Pumps: AROM;Both;20 reps;Supine Quad Sets: AROM;Both;10 reps;Supine Heel Slides: AAROM;Left;20 reps;Supine Hip ABduction/ADduction: AAROM;Left;15 reps;Supine Long Arc Quad: AROM;Left;10 reps;Seated    General Comments        Pertinent Vitals/Pain Pain Assessment: 0-10 Pain Score: 5  Pain Location: L hip Pain Descriptors / Indicators: Aching;Sore Pain Intervention(s): Limited activity within patient's tolerance;Monitored during session;Premedicated before session    [place] expects to be discharged to:: Private residence Living Arrangements: Spouse/significant other Available Help at Discharge: Family Type of Home: House Home Access: Stairs to enter Entrance Stairs-Rails: Right;Left Home Layout: Two level Home Equipment: Environmental consultant - 2 wheels;Bedside commode;Cane - single point      Prior Function Level of Independence: Independent;Independent with assistive device(s)          PT Goals (current goals can now be found in the care plan section) Acute Rehab PT Goals Patient Stated Goal: Regain IND  PT Goal Formulation: With patient Time For Goal Achievement: 12/03/17 Potential to Achieve Goals: Good Progress towards PT goals: Progressing toward  goals    Frequency    7X/week      PT Plan Current plan remains appropriate    Co-evaluation              AM-PAC PT "6 Clicks" Daily Activity  Outcome Measure  Difficulty turning over in bed (including adjusting bedclothes, sheets and blankets)?: A Lot Difficulty moving from lying on back to sitting on the side of the bed? : A Lot Difficulty sitting  down on and standing up from a chair with arms (e.g., wheelchair, bedside commode, etc,.)?: A Lot Help needed moving to and from a bed to chair (including a wheelchair)?: A Little Help needed walking in hospital room?: A Little Help needed climbing 3-5 steps with a railing? : A Little 6 Click Score: 15    End of Session Equipment Utilized During Treatment: Gait belt Activity Tolerance: Patient tolerated treatment well Patient left: in bed;with call bell/phone within reach Nurse Communication: Mobility status PT Visit Diagnosis: Difficulty in walking, not elsewhere classified (R26.2)     Time: 0051-1021 PT Time Calculation (min) (ACUTE ONLY): 25 min  Charges:  $Gait Training: 8-22 mins $Therapeutic Exercise: 23-37 mins $Therapeutic Activity: 8-22 mins                    G Codes:       Pg [phone]    Christena Sunderlin 11/26/2017, 3:34 PM

## 2017-12-29 DIAGNOSIS — Z471 Aftercare following joint replacement surgery: Secondary | ICD-10-CM | POA: Diagnosis not present

## 2017-12-29 DIAGNOSIS — Z96642 Presence of left artificial hip joint: Secondary | ICD-10-CM | POA: Diagnosis not present

## 2018-02-16 DIAGNOSIS — Z299 Encounter for prophylactic measures, unspecified: Secondary | ICD-10-CM | POA: Diagnosis not present

## 2018-02-16 DIAGNOSIS — Z6830 Body mass index (BMI) 30.0-30.9, adult: Secondary | ICD-10-CM | POA: Diagnosis not present

## 2018-02-16 DIAGNOSIS — I2699 Other pulmonary embolism without acute cor pulmonale: Secondary | ICD-10-CM | POA: Diagnosis not present

## 2018-02-16 DIAGNOSIS — G4700 Insomnia, unspecified: Secondary | ICD-10-CM | POA: Diagnosis not present

## 2018-02-16 DIAGNOSIS — I10 Essential (primary) hypertension: Secondary | ICD-10-CM | POA: Diagnosis not present

## 2018-04-19 DIAGNOSIS — H2513 Age-related nuclear cataract, bilateral: Secondary | ICD-10-CM | POA: Diagnosis not present

## 2018-04-19 DIAGNOSIS — H5211 Myopia, right eye: Secondary | ICD-10-CM | POA: Diagnosis not present

## 2018-04-19 DIAGNOSIS — H5202 Hypermetropia, left eye: Secondary | ICD-10-CM | POA: Diagnosis not present

## 2018-04-19 DIAGNOSIS — H52223 Regular astigmatism, bilateral: Secondary | ICD-10-CM | POA: Diagnosis not present

## 2018-05-07 DIAGNOSIS — I10 Essential (primary) hypertension: Secondary | ICD-10-CM | POA: Diagnosis not present

## 2018-05-07 DIAGNOSIS — Z7982 Long term (current) use of aspirin: Secondary | ICD-10-CM | POA: Diagnosis not present

## 2018-05-07 DIAGNOSIS — E7800 Pure hypercholesterolemia, unspecified: Secondary | ICD-10-CM | POA: Diagnosis not present

## 2018-05-07 DIAGNOSIS — N132 Hydronephrosis with renal and ureteral calculous obstruction: Secondary | ICD-10-CM | POA: Diagnosis not present

## 2018-05-07 DIAGNOSIS — Z79899 Other long term (current) drug therapy: Secondary | ICD-10-CM | POA: Diagnosis not present

## 2018-05-07 DIAGNOSIS — N200 Calculus of kidney: Secondary | ICD-10-CM | POA: Diagnosis not present

## 2018-05-20 DIAGNOSIS — E7800 Pure hypercholesterolemia, unspecified: Secondary | ICD-10-CM | POA: Diagnosis not present

## 2018-05-20 DIAGNOSIS — Z6830 Body mass index (BMI) 30.0-30.9, adult: Secondary | ICD-10-CM | POA: Diagnosis not present

## 2018-05-20 DIAGNOSIS — G4700 Insomnia, unspecified: Secondary | ICD-10-CM | POA: Diagnosis not present

## 2018-05-20 DIAGNOSIS — Z299 Encounter for prophylactic measures, unspecified: Secondary | ICD-10-CM | POA: Diagnosis not present

## 2018-05-20 DIAGNOSIS — I10 Essential (primary) hypertension: Secondary | ICD-10-CM | POA: Diagnosis not present

## 2018-07-15 DIAGNOSIS — Z96642 Presence of left artificial hip joint: Secondary | ICD-10-CM | POA: Diagnosis not present

## 2018-07-15 DIAGNOSIS — M25551 Pain in right hip: Secondary | ICD-10-CM | POA: Diagnosis not present

## 2018-07-15 DIAGNOSIS — Z471 Aftercare following joint replacement surgery: Secondary | ICD-10-CM | POA: Diagnosis not present

## 2018-07-20 DIAGNOSIS — S76002A Unspecified injury of muscle, fascia and tendon of left hip, initial encounter: Secondary | ICD-10-CM | POA: Diagnosis not present

## 2018-07-20 DIAGNOSIS — M47816 Spondylosis without myelopathy or radiculopathy, lumbar region: Secondary | ICD-10-CM | POA: Diagnosis not present

## 2018-07-20 DIAGNOSIS — M9903 Segmental and somatic dysfunction of lumbar region: Secondary | ICD-10-CM | POA: Diagnosis not present

## 2018-07-21 DIAGNOSIS — M9903 Segmental and somatic dysfunction of lumbar region: Secondary | ICD-10-CM | POA: Diagnosis not present

## 2018-07-21 DIAGNOSIS — S76002A Unspecified injury of muscle, fascia and tendon of left hip, initial encounter: Secondary | ICD-10-CM | POA: Diagnosis not present

## 2018-07-21 DIAGNOSIS — M47816 Spondylosis without myelopathy or radiculopathy, lumbar region: Secondary | ICD-10-CM | POA: Diagnosis not present

## 2018-07-22 DIAGNOSIS — M47816 Spondylosis without myelopathy or radiculopathy, lumbar region: Secondary | ICD-10-CM | POA: Diagnosis not present

## 2018-07-22 DIAGNOSIS — S76002A Unspecified injury of muscle, fascia and tendon of left hip, initial encounter: Secondary | ICD-10-CM | POA: Diagnosis not present

## 2018-07-22 DIAGNOSIS — M9903 Segmental and somatic dysfunction of lumbar region: Secondary | ICD-10-CM | POA: Diagnosis not present

## 2018-07-23 DIAGNOSIS — M9903 Segmental and somatic dysfunction of lumbar region: Secondary | ICD-10-CM | POA: Diagnosis not present

## 2018-07-23 DIAGNOSIS — S76002A Unspecified injury of muscle, fascia and tendon of left hip, initial encounter: Secondary | ICD-10-CM | POA: Diagnosis not present

## 2018-07-23 DIAGNOSIS — M47816 Spondylosis without myelopathy or radiculopathy, lumbar region: Secondary | ICD-10-CM | POA: Diagnosis not present

## 2018-07-26 DIAGNOSIS — S76002A Unspecified injury of muscle, fascia and tendon of left hip, initial encounter: Secondary | ICD-10-CM | POA: Diagnosis not present

## 2018-07-26 DIAGNOSIS — M9903 Segmental and somatic dysfunction of lumbar region: Secondary | ICD-10-CM | POA: Diagnosis not present

## 2018-07-26 DIAGNOSIS — M47816 Spondylosis without myelopathy or radiculopathy, lumbar region: Secondary | ICD-10-CM | POA: Diagnosis not present

## 2018-08-10 DIAGNOSIS — M25552 Pain in left hip: Secondary | ICD-10-CM | POA: Diagnosis not present

## 2018-08-17 DIAGNOSIS — Z299 Encounter for prophylactic measures, unspecified: Secondary | ICD-10-CM | POA: Diagnosis not present

## 2018-08-17 DIAGNOSIS — I10 Essential (primary) hypertension: Secondary | ICD-10-CM | POA: Diagnosis not present

## 2018-08-17 DIAGNOSIS — Z2821 Immunization not carried out because of patient refusal: Secondary | ICD-10-CM | POA: Diagnosis not present

## 2018-08-17 DIAGNOSIS — M549 Dorsalgia, unspecified: Secondary | ICD-10-CM | POA: Diagnosis not present

## 2018-08-17 DIAGNOSIS — Z789 Other specified health status: Secondary | ICD-10-CM | POA: Diagnosis not present

## 2018-08-17 DIAGNOSIS — G4700 Insomnia, unspecified: Secondary | ICD-10-CM | POA: Diagnosis not present

## 2018-08-17 DIAGNOSIS — Z6831 Body mass index (BMI) 31.0-31.9, adult: Secondary | ICD-10-CM | POA: Diagnosis not present

## 2018-09-06 DIAGNOSIS — H2513 Age-related nuclear cataract, bilateral: Secondary | ICD-10-CM | POA: Diagnosis not present

## 2018-09-06 DIAGNOSIS — H35033 Hypertensive retinopathy, bilateral: Secondary | ICD-10-CM | POA: Diagnosis not present

## 2018-09-06 DIAGNOSIS — H25013 Cortical age-related cataract, bilateral: Secondary | ICD-10-CM | POA: Diagnosis not present

## 2018-09-06 DIAGNOSIS — H2511 Age-related nuclear cataract, right eye: Secondary | ICD-10-CM | POA: Diagnosis not present

## 2018-09-06 DIAGNOSIS — H25011 Cortical age-related cataract, right eye: Secondary | ICD-10-CM | POA: Diagnosis not present

## 2018-09-06 DIAGNOSIS — H353112 Nonexudative age-related macular degeneration, right eye, intermediate dry stage: Secondary | ICD-10-CM | POA: Diagnosis not present

## 2018-09-17 DIAGNOSIS — S32511A Fracture of superior rim of right pubis, initial encounter for closed fracture: Secondary | ICD-10-CM | POA: Diagnosis not present

## 2018-09-17 DIAGNOSIS — M25551 Pain in right hip: Secondary | ICD-10-CM | POA: Diagnosis not present

## 2018-09-17 DIAGNOSIS — S32591A Other specified fracture of right pubis, initial encounter for closed fracture: Secondary | ICD-10-CM | POA: Diagnosis not present

## 2018-09-20 DIAGNOSIS — E2839 Other primary ovarian failure: Secondary | ICD-10-CM | POA: Diagnosis not present

## 2018-11-15 DIAGNOSIS — Z6831 Body mass index (BMI) 31.0-31.9, adult: Secondary | ICD-10-CM | POA: Diagnosis not present

## 2018-11-15 DIAGNOSIS — G4700 Insomnia, unspecified: Secondary | ICD-10-CM | POA: Diagnosis not present

## 2018-11-15 DIAGNOSIS — Z299 Encounter for prophylactic measures, unspecified: Secondary | ICD-10-CM | POA: Diagnosis not present

## 2018-11-15 DIAGNOSIS — Z713 Dietary counseling and surveillance: Secondary | ICD-10-CM | POA: Diagnosis not present

## 2018-11-15 DIAGNOSIS — I10 Essential (primary) hypertension: Secondary | ICD-10-CM | POA: Diagnosis not present

## 2018-12-10 DIAGNOSIS — S32591D Other specified fracture of right pubis, subsequent encounter for fracture with routine healing: Secondary | ICD-10-CM | POA: Diagnosis not present

## 2018-12-10 DIAGNOSIS — M25551 Pain in right hip: Secondary | ICD-10-CM | POA: Diagnosis not present

## 2018-12-21 DIAGNOSIS — Z299 Encounter for prophylactic measures, unspecified: Secondary | ICD-10-CM | POA: Diagnosis not present

## 2018-12-21 DIAGNOSIS — Z6829 Body mass index (BMI) 29.0-29.9, adult: Secondary | ICD-10-CM | POA: Diagnosis not present

## 2018-12-21 DIAGNOSIS — M25511 Pain in right shoulder: Secondary | ICD-10-CM | POA: Diagnosis not present

## 2018-12-21 DIAGNOSIS — I10 Essential (primary) hypertension: Secondary | ICD-10-CM | POA: Diagnosis not present

## 2018-12-21 DIAGNOSIS — E7800 Pure hypercholesterolemia, unspecified: Secondary | ICD-10-CM | POA: Diagnosis not present

## 2019-01-24 DIAGNOSIS — I868 Varicose veins of other specified sites: Secondary | ICD-10-CM | POA: Diagnosis not present

## 2019-01-24 DIAGNOSIS — Z299 Encounter for prophylactic measures, unspecified: Secondary | ICD-10-CM | POA: Diagnosis not present

## 2019-01-24 DIAGNOSIS — I8390 Asymptomatic varicose veins of unspecified lower extremity: Secondary | ICD-10-CM | POA: Diagnosis not present

## 2019-01-24 DIAGNOSIS — I10 Essential (primary) hypertension: Secondary | ICD-10-CM | POA: Diagnosis not present

## 2019-01-24 DIAGNOSIS — Z79899 Other long term (current) drug therapy: Secondary | ICD-10-CM | POA: Diagnosis not present

## 2019-01-24 DIAGNOSIS — Z6828 Body mass index (BMI) 28.0-28.9, adult: Secondary | ICD-10-CM | POA: Diagnosis not present

## 2019-01-25 DIAGNOSIS — I809 Phlebitis and thrombophlebitis of unspecified site: Secondary | ICD-10-CM | POA: Diagnosis not present

## 2019-01-25 DIAGNOSIS — I8390 Asymptomatic varicose veins of unspecified lower extremity: Secondary | ICD-10-CM | POA: Diagnosis not present

## 2019-01-25 DIAGNOSIS — Z79899 Other long term (current) drug therapy: Secondary | ICD-10-CM | POA: Diagnosis not present

## 2019-01-25 DIAGNOSIS — R5383 Other fatigue: Secondary | ICD-10-CM | POA: Diagnosis not present

## 2019-01-25 DIAGNOSIS — I10 Essential (primary) hypertension: Secondary | ICD-10-CM | POA: Diagnosis not present

## 2019-01-25 DIAGNOSIS — E559 Vitamin D deficiency, unspecified: Secondary | ICD-10-CM | POA: Diagnosis not present

## 2019-01-25 DIAGNOSIS — I2699 Other pulmonary embolism without acute cor pulmonale: Secondary | ICD-10-CM | POA: Diagnosis not present

## 2019-01-26 DIAGNOSIS — R600 Localized edema: Secondary | ICD-10-CM | POA: Diagnosis not present

## 2019-01-26 DIAGNOSIS — I8312 Varicose veins of left lower extremity with inflammation: Secondary | ICD-10-CM | POA: Diagnosis not present

## 2019-01-26 DIAGNOSIS — R58 Hemorrhage, not elsewhere classified: Secondary | ICD-10-CM | POA: Diagnosis not present

## 2019-02-01 DIAGNOSIS — I8312 Varicose veins of left lower extremity with inflammation: Secondary | ICD-10-CM | POA: Diagnosis not present

## 2019-02-03 DIAGNOSIS — I8311 Varicose veins of right lower extremity with inflammation: Secondary | ICD-10-CM | POA: Diagnosis not present

## 2019-02-09 DIAGNOSIS — I10 Essential (primary) hypertension: Secondary | ICD-10-CM | POA: Diagnosis not present

## 2019-02-09 DIAGNOSIS — G4700 Insomnia, unspecified: Secondary | ICD-10-CM | POA: Diagnosis not present

## 2019-02-09 DIAGNOSIS — Z6828 Body mass index (BMI) 28.0-28.9, adult: Secondary | ICD-10-CM | POA: Diagnosis not present

## 2019-02-09 DIAGNOSIS — Z713 Dietary counseling and surveillance: Secondary | ICD-10-CM | POA: Diagnosis not present

## 2019-02-09 DIAGNOSIS — Z299 Encounter for prophylactic measures, unspecified: Secondary | ICD-10-CM | POA: Diagnosis not present

## 2019-02-15 DIAGNOSIS — I8312 Varicose veins of left lower extremity with inflammation: Secondary | ICD-10-CM | POA: Diagnosis not present

## 2019-02-17 DIAGNOSIS — I8312 Varicose veins of left lower extremity with inflammation: Secondary | ICD-10-CM | POA: Diagnosis not present

## 2019-02-22 DIAGNOSIS — I8312 Varicose veins of left lower extremity with inflammation: Secondary | ICD-10-CM | POA: Diagnosis not present

## 2019-03-08 ENCOUNTER — Encounter: Payer: Medicare Other | Admitting: Vascular Surgery

## 2019-03-08 ENCOUNTER — Encounter (HOSPITAL_COMMUNITY): Payer: Medicare Other

## 2019-03-08 DIAGNOSIS — I8312 Varicose veins of left lower extremity with inflammation: Secondary | ICD-10-CM | POA: Diagnosis not present

## 2019-03-29 DIAGNOSIS — M7981 Nontraumatic hematoma of soft tissue: Secondary | ICD-10-CM | POA: Diagnosis not present

## 2019-03-29 DIAGNOSIS — I8312 Varicose veins of left lower extremity with inflammation: Secondary | ICD-10-CM | POA: Diagnosis not present

## 2019-05-13 DIAGNOSIS — G4700 Insomnia, unspecified: Secondary | ICD-10-CM | POA: Diagnosis not present

## 2019-05-13 DIAGNOSIS — Z713 Dietary counseling and surveillance: Secondary | ICD-10-CM | POA: Diagnosis not present

## 2019-05-13 DIAGNOSIS — Z299 Encounter for prophylactic measures, unspecified: Secondary | ICD-10-CM | POA: Diagnosis not present

## 2019-05-13 DIAGNOSIS — I10 Essential (primary) hypertension: Secondary | ICD-10-CM | POA: Diagnosis not present

## 2019-05-13 DIAGNOSIS — Z6828 Body mass index (BMI) 28.0-28.9, adult: Secondary | ICD-10-CM | POA: Diagnosis not present

## 2019-06-06 DIAGNOSIS — Z713 Dietary counseling and surveillance: Secondary | ICD-10-CM | POA: Diagnosis not present

## 2019-06-06 DIAGNOSIS — I10 Essential (primary) hypertension: Secondary | ICD-10-CM | POA: Diagnosis not present

## 2019-06-06 DIAGNOSIS — Z299 Encounter for prophylactic measures, unspecified: Secondary | ICD-10-CM | POA: Diagnosis not present

## 2019-06-06 DIAGNOSIS — Z6828 Body mass index (BMI) 28.0-28.9, adult: Secondary | ICD-10-CM | POA: Diagnosis not present

## 2019-06-15 DIAGNOSIS — Z713 Dietary counseling and surveillance: Secondary | ICD-10-CM | POA: Diagnosis not present

## 2019-06-15 DIAGNOSIS — Z6830 Body mass index (BMI) 30.0-30.9, adult: Secondary | ICD-10-CM | POA: Diagnosis not present

## 2019-06-15 DIAGNOSIS — Z299 Encounter for prophylactic measures, unspecified: Secondary | ICD-10-CM | POA: Diagnosis not present

## 2019-06-15 DIAGNOSIS — I10 Essential (primary) hypertension: Secondary | ICD-10-CM | POA: Diagnosis not present

## 2019-06-15 DIAGNOSIS — Z6828 Body mass index (BMI) 28.0-28.9, adult: Secondary | ICD-10-CM | POA: Diagnosis not present

## 2019-07-15 HISTORY — PX: EYE SURGERY: SHX253

## 2019-07-20 DIAGNOSIS — G4700 Insomnia, unspecified: Secondary | ICD-10-CM | POA: Diagnosis not present

## 2019-07-20 DIAGNOSIS — Z789 Other specified health status: Secondary | ICD-10-CM | POA: Diagnosis not present

## 2019-07-20 DIAGNOSIS — I10 Essential (primary) hypertension: Secondary | ICD-10-CM | POA: Diagnosis not present

## 2019-07-20 DIAGNOSIS — Z299 Encounter for prophylactic measures, unspecified: Secondary | ICD-10-CM | POA: Diagnosis not present

## 2019-07-20 DIAGNOSIS — Z86711 Personal history of pulmonary embolism: Secondary | ICD-10-CM | POA: Diagnosis not present

## 2019-07-20 DIAGNOSIS — Z6829 Body mass index (BMI) 29.0-29.9, adult: Secondary | ICD-10-CM | POA: Diagnosis not present

## 2019-07-20 DIAGNOSIS — Z2821 Immunization not carried out because of patient refusal: Secondary | ICD-10-CM | POA: Diagnosis not present

## 2019-08-04 DIAGNOSIS — Z299 Encounter for prophylactic measures, unspecified: Secondary | ICD-10-CM | POA: Diagnosis not present

## 2019-08-04 DIAGNOSIS — E7800 Pure hypercholesterolemia, unspecified: Secondary | ICD-10-CM | POA: Diagnosis not present

## 2019-08-04 DIAGNOSIS — I10 Essential (primary) hypertension: Secondary | ICD-10-CM | POA: Diagnosis not present

## 2019-08-04 DIAGNOSIS — Z6829 Body mass index (BMI) 29.0-29.9, adult: Secondary | ICD-10-CM | POA: Diagnosis not present

## 2019-08-04 DIAGNOSIS — Z713 Dietary counseling and surveillance: Secondary | ICD-10-CM | POA: Diagnosis not present

## 2019-08-09 DIAGNOSIS — I10 Essential (primary) hypertension: Secondary | ICD-10-CM | POA: Diagnosis not present

## 2019-08-19 DIAGNOSIS — Z23 Encounter for immunization: Secondary | ICD-10-CM | POA: Diagnosis not present

## 2019-08-23 DIAGNOSIS — I10 Essential (primary) hypertension: Secondary | ICD-10-CM | POA: Diagnosis not present

## 2019-08-23 DIAGNOSIS — Z299 Encounter for prophylactic measures, unspecified: Secondary | ICD-10-CM | POA: Diagnosis not present

## 2019-08-23 DIAGNOSIS — R519 Headache, unspecified: Secondary | ICD-10-CM | POA: Diagnosis not present

## 2019-08-23 DIAGNOSIS — Z713 Dietary counseling and surveillance: Secondary | ICD-10-CM | POA: Diagnosis not present

## 2019-08-23 DIAGNOSIS — Z6830 Body mass index (BMI) 30.0-30.9, adult: Secondary | ICD-10-CM | POA: Diagnosis not present

## 2019-08-26 DIAGNOSIS — H1031 Unspecified acute conjunctivitis, right eye: Secondary | ICD-10-CM | POA: Diagnosis not present

## 2019-09-09 DIAGNOSIS — I10 Essential (primary) hypertension: Secondary | ICD-10-CM | POA: Diagnosis not present

## 2019-09-16 DIAGNOSIS — Z23 Encounter for immunization: Secondary | ICD-10-CM | POA: Diagnosis not present

## 2019-09-22 DIAGNOSIS — H353132 Nonexudative age-related macular degeneration, bilateral, intermediate dry stage: Secondary | ICD-10-CM | POA: Diagnosis not present

## 2019-09-22 DIAGNOSIS — H35033 Hypertensive retinopathy, bilateral: Secondary | ICD-10-CM | POA: Diagnosis not present

## 2019-09-22 DIAGNOSIS — H25013 Cortical age-related cataract, bilateral: Secondary | ICD-10-CM | POA: Diagnosis not present

## 2019-09-22 DIAGNOSIS — H2511 Age-related nuclear cataract, right eye: Secondary | ICD-10-CM | POA: Diagnosis not present

## 2019-09-22 DIAGNOSIS — H2513 Age-related nuclear cataract, bilateral: Secondary | ICD-10-CM | POA: Diagnosis not present

## 2019-09-22 DIAGNOSIS — H25043 Posterior subcapsular polar age-related cataract, bilateral: Secondary | ICD-10-CM | POA: Diagnosis not present

## 2019-09-27 DIAGNOSIS — H2511 Age-related nuclear cataract, right eye: Secondary | ICD-10-CM | POA: Diagnosis not present

## 2019-09-27 DIAGNOSIS — H25811 Combined forms of age-related cataract, right eye: Secondary | ICD-10-CM | POA: Diagnosis not present

## 2019-10-19 DIAGNOSIS — H2589 Other age-related cataract: Secondary | ICD-10-CM | POA: Diagnosis not present

## 2019-10-19 DIAGNOSIS — H25012 Cortical age-related cataract, left eye: Secondary | ICD-10-CM | POA: Diagnosis not present

## 2019-10-19 DIAGNOSIS — H25042 Posterior subcapsular polar age-related cataract, left eye: Secondary | ICD-10-CM | POA: Diagnosis not present

## 2019-10-19 DIAGNOSIS — H2512 Age-related nuclear cataract, left eye: Secondary | ICD-10-CM | POA: Diagnosis not present

## 2019-10-25 DIAGNOSIS — H25042 Posterior subcapsular polar age-related cataract, left eye: Secondary | ICD-10-CM | POA: Diagnosis not present

## 2019-10-25 DIAGNOSIS — H25812 Combined forms of age-related cataract, left eye: Secondary | ICD-10-CM | POA: Diagnosis not present

## 2019-10-25 DIAGNOSIS — H25012 Cortical age-related cataract, left eye: Secondary | ICD-10-CM | POA: Diagnosis not present

## 2019-10-25 DIAGNOSIS — H2512 Age-related nuclear cataract, left eye: Secondary | ICD-10-CM | POA: Diagnosis not present

## 2019-10-25 DIAGNOSIS — H2589 Other age-related cataract: Secondary | ICD-10-CM | POA: Diagnosis not present

## 2019-11-11 DIAGNOSIS — I10 Essential (primary) hypertension: Secondary | ICD-10-CM | POA: Diagnosis not present

## 2019-11-11 DIAGNOSIS — Z299 Encounter for prophylactic measures, unspecified: Secondary | ICD-10-CM | POA: Diagnosis not present

## 2019-11-11 DIAGNOSIS — G4700 Insomnia, unspecified: Secondary | ICD-10-CM | POA: Diagnosis not present

## 2019-12-18 DIAGNOSIS — R05 Cough: Secondary | ICD-10-CM | POA: Diagnosis not present

## 2019-12-18 DIAGNOSIS — R0989 Other specified symptoms and signs involving the circulatory and respiratory systems: Secondary | ICD-10-CM | POA: Diagnosis not present

## 2020-01-18 DIAGNOSIS — M7601 Gluteal tendinitis, right hip: Secondary | ICD-10-CM | POA: Diagnosis not present

## 2020-01-26 DIAGNOSIS — M169 Osteoarthritis of hip, unspecified: Secondary | ICD-10-CM | POA: Diagnosis not present

## 2020-01-26 DIAGNOSIS — M25551 Pain in right hip: Secondary | ICD-10-CM | POA: Diagnosis not present

## 2020-01-26 DIAGNOSIS — Z96641 Presence of right artificial hip joint: Secondary | ICD-10-CM | POA: Diagnosis not present

## 2020-02-07 DIAGNOSIS — Z299 Encounter for prophylactic measures, unspecified: Secondary | ICD-10-CM | POA: Diagnosis not present

## 2020-02-07 DIAGNOSIS — M25552 Pain in left hip: Secondary | ICD-10-CM | POA: Diagnosis not present

## 2020-02-07 DIAGNOSIS — I10 Essential (primary) hypertension: Secondary | ICD-10-CM | POA: Diagnosis not present

## 2020-02-07 DIAGNOSIS — G4700 Insomnia, unspecified: Secondary | ICD-10-CM | POA: Diagnosis not present

## 2020-02-10 DIAGNOSIS — I10 Essential (primary) hypertension: Secondary | ICD-10-CM | POA: Diagnosis not present

## 2020-02-10 DIAGNOSIS — Z789 Other specified health status: Secondary | ICD-10-CM | POA: Diagnosis not present

## 2020-02-10 DIAGNOSIS — Z299 Encounter for prophylactic measures, unspecified: Secondary | ICD-10-CM | POA: Diagnosis not present

## 2020-02-10 DIAGNOSIS — Z713 Dietary counseling and surveillance: Secondary | ICD-10-CM | POA: Diagnosis not present

## 2020-02-10 DIAGNOSIS — Z6830 Body mass index (BMI) 30.0-30.9, adult: Secondary | ICD-10-CM | POA: Diagnosis not present

## 2020-02-13 NOTE — Patient Instructions (Addendum)
DUE TO COVID-19 ONLY ONE VISITOR IS ALLOWED TO COME WITH YOU AND STAY IN THE WAITING ROOM ONLY DURING PRE OP AND PROCEDURE DAY OF SURGERY. THE 2 VISITORS MAY VISIT WITH YOU AFTER SURGERY IN YOUR PRIVATE ROOM DURING VISITING HOURS ONLY!  YOU NEED TO HAVE A COVID 19 TEST ON_8/7______ @__10 :00_____, THIS TEST MUST BE DONE BEFORE SURGERY,  COVID TESTING SITE [address] [place] [address], IT IS ON THE RIGHT GOING OUT [address] APPROXITAMELTELY 2 MINUTES PAST [employer] ON THE RIGHT. ONCE YOUR COVID TEST IS COMPLETED,  PLEASE BEGIN THE QUARANTINE INSTRUCTIONS AS OUTLINED IN YOUR HANDOUT.                Melodye Ped    Your procedure is scheduled on: 02/22/20   Report to [facility] Main  Entrance   Report to admitting at   10:05 AM     Call this number if you have problems the morning of surgery [place], NO CHEWING GUM [place].   No food after midnight.    You may have clear liquid until 9:30 AM.      CLEAR LIQUID DIET   Foods Allowed                                                                     Foods Excluded  Coffee and tea, regular and decaf                             liquids that you cannot  Plain Jell-O any favor except red or purple                                           see through such as: Fruit ices (not with fruit pulp)                                     milk, soups, orange juice  Iced Popsicles                                    All solid food Carbonated beverages, regular and diet                                    Cranberry, grape and apple juices Sports drinks like Gatorade Lightly seasoned clear broth or consume(fat free) Sugar, honey syrup    At 9:30 AM drink pre surgery drink.   Nothing by mouth after 9:30 AM.   Take these medicines the morning of surgery with A SIP OF WATER: Amlodipine,                                  You may  not have any metal on your body including hair pins and              piercings  Do not wear jewelry, make-up, lotions, powders or perfumes, deodorant             Do not wear nail polish on your fingernails.  Do not shave  48 hours prior to surgery.             .   Do not bring valuables to the hospital. [place].  Contacts, dentures or bridgework may not be worn into surgery.      _____________________________________________________________________             [facility] - Preparing for Surgery  Before surgery, you can play an important role.   Because skin is not sterile, your skin needs to be as free of germs as possible.   You can reduce the number of germs on your skin by washing with CHG (chlorahexidine gluconate) soap before surgery.   CHG is an antiseptic cleaner which kills germs and bonds with the skin to continue killing germs even after washing. Please DO NOT use if you have an allergy to CHG or antibacterial soaps.   If your skin becomes reddened/irritated stop using the CHG and inform your nurse when you arrive at Short Stay. Do not shave (including legs and underarms) for at least 48 hours prior to the first CHG shower.  . Please follow these instructions carefully:  1.  Shower with CHG Soap the night before surgery and the  morning of Surgery.  2.  If you choose to wash your hair, wash your hair first as usual with your  normal  shampoo.  3.  After you shampoo, rinse your hair and body thoroughly to remove the  shampoo.                                        4.  Use CHG as you would any other liquid soap.  You can apply chg directly  to the skin and wash                       Gently with a scrungie or clean washcloth.  5.  Apply the CHG Soap to your body ONLY FROM THE NECK DOWN.   Do not use on face/ open                           Wound or open sores. Avoid contact with eyes, ears mouth and genitals (private parts).                        Wash face,  Genitals (private parts) with your normal soap.             6.  Wash thoroughly, paying special attention to the area where your surgery  will be performed.  7.  Thoroughly rinse your body with warm water from the neck down.  8.  DO NOT shower/wash with your normal soap after using and rinsing off  the CHG Soap.             9  Pat yourself dry with a clean towel.  10.  Wear clean pajamas.            11.  Place clean sheets on your bed the night of your first shower and do not  sleep with pets. Day of Surgery : Do not apply any lotions/deodorants the morning of surgery.  Please wear clean clothes to the hospital/surgery center.  FAILURE TO FOLLOW THESE INSTRUCTIONS MAY RESULT IN THE CANCELLATION OF YOUR SURGERY PATIENT SIGNATURE_________________________________  NURSE SIGNATURE__________________________________  ________________________________________________________________________   Adam Phenix  An incentive spirometer is a tool that can help keep your lungs clear and active. This tool measures how well you are filling your lungs with each breath. Taking long deep breaths may help reverse or decrease the chance of developing breathing (pulmonary) problems (especially infection) following:  A long period of time when you are unable to move or be active. BEFORE THE PROCEDURE   If the spirometer includes an indicator to show your best effort, your nurse or respiratory therapist will set it to a desired goal.  If possible, sit up straight or lean slightly forward. Try not to slouch.  Hold the incentive spirometer in an upright position. INSTRUCTIONS FOR USE  1. Sit on the edge of your bed if possible, or sit up as far as you can in bed or on a chair. 2. Hold the incentive spirometer in an upright position. 3. Breathe out normally. 4. Place the mouthpiece in your mouth and seal your lips tightly around it. 5. Breathe in slowly and  as deeply as possible, raising the piston or the ball toward the top of the column. 6. Hold your breath for 3-5 seconds or for as long as possible. Allow the piston or ball to fall to the bottom of the column. 7. Remove the mouthpiece from your mouth and breathe out normally. 8. Rest for a few seconds and repeat Steps 1 through 7 at least 10 times every 1-2 hours when you are awake. Take your time and take a few normal breaths between deep breaths. 9. The spirometer may include an indicator to show your best effort. Use the indicator as a goal to work toward during each repetition. 10. After each set of 10 deep breaths, practice coughing to be sure your lungs are clear. If you have an incision (the cut made at the time of surgery), support your incision when coughing by placing a pillow or rolled up towels firmly against it. Once you are able to get out of bed, walk around indoors and cough well. You may stop using the incentive spirometer when instructed by your caregiver.  RISKS AND COMPLICATIONS  Take your time so you do not get dizzy or light-headed.  If you are in pain, you may need to take or ask for pain medication before doing incentive spirometry. It is harder to take a deep breath if you are having pain. AFTER USE  Rest and breathe slowly and easily.  It can be helpful to keep track of a log of your progress. Your caregiver can provide you with a simple table to help with this. If you are using the spirometer at home, follow these instructions: [place] IF:   You are having difficultly using the spirometer.  You have trouble using the spirometer as often as instructed.  Your pain medication is not giving enough relief while using the spirometer.  You develop fever of 100.5 F (38.1 C) or higher. SEEK IMMEDIATE MEDICAL CARE IF:   You cough up bloody sputum  that had not been present before.  You develop fever of 102 F (38.9 C) or greater.  You develop worsening  pain at or near the incision site. MAKE SURE YOU:   Understand these instructions.  Will watch your condition.  Will get help right away if you are not doing well or get worse. Document Released: 11/10/2006 Document Revised: 09/22/2011 Document Reviewed: 01/11/2007 [facility] Patient Information 2014 [place], [place].   ________________________________________________________________________

## 2020-02-14 ENCOUNTER — Other Ambulatory Visit: Payer: Self-pay

## 2020-02-14 ENCOUNTER — Encounter (HOSPITAL_COMMUNITY): Payer: Self-pay

## 2020-02-14 ENCOUNTER — Encounter (HOSPITAL_COMMUNITY)
Admission: RE | Admit: 2020-02-14 | Discharge: 2020-02-14 | Disposition: A | Discharge status: Home / Self Care | Payer: Medicare Other | Source: Ambulatory Visit | Attending: Orthopedic Surgery | Admitting: Orthopedic Surgery

## 2020-02-14 DIAGNOSIS — I10 Essential (primary) hypertension: Secondary | ICD-10-CM | POA: Diagnosis not present

## 2020-02-14 DIAGNOSIS — Z01818 Encounter for other preprocedural examination: Secondary | ICD-10-CM | POA: Diagnosis not present

## 2020-02-14 LAB — COMPREHENSIVE METABOLIC PANEL
ALT: 20 U/L (ref 0–44)
AST: 21 U/L (ref 15–41)
Albumin: 4.5 g/dL (ref 3.5–5.0)
Alkaline Phosphatase: 52 U/L (ref 38–126)
Anion gap: 8 (ref 5–15)
BUN: 20 mg/dL (ref 8–23)
CO2: 28 mmol/L (ref 22–32)
Calcium: 8.8 mg/dL — ABNORMAL LOW (ref 8.9–10.3)
Chloride: 104 mmol/L (ref 98–111)
Creatinine, Ser: 0.69 mg/dL (ref 0.44–1.00)
GFR calc Af Amer: >60 mL/min (ref >60)
GFR calc non Af Amer: >60 mL/min (ref >60)
Glucose, Bld: 93 mg/dL (ref 70–99)
Potassium: 3.5 mmol/L (ref 3.5–5.1)
Sodium: 140 mmol/L (ref 135–145)
Total Bilirubin: 0.5 mg/dL (ref 0.3–1.2)
Total Protein: 6.8 g/dL (ref 6.5–8.1)

## 2020-02-14 LAB — CBC
HCT: 42.9 % (ref 36.0–46.0)
Hemoglobin: 13.4 g/dL (ref 12.0–15.0)
MCH: 28.8 pg (ref 26.0–34.0)
MCHC: 31.2 g/dL (ref 30.0–36.0)
MCV: 92.3 fL (ref 80.0–100.0)
Platelets: 306 10*3/uL (ref 150–400)
RBC: 4.65 MIL/uL (ref 3.87–5.11)
RDW: 12.7 % (ref 11.5–15.5)
WBC: 6.4 10*3/uL (ref 4.0–10.5)
nRBC: 0 % (ref 0.0–0.2)

## 2020-02-14 LAB — APTT: aPTT: 25 seconds (ref 24–36)

## 2020-02-14 LAB — PROTIME-INR
INR: 0.9 (ref 0.8–1.2)
Prothrombin Time: 11.5 seconds (ref 11.4–15.2)

## 2020-02-14 LAB — SURGICAL PCR SCREEN
MRSA, PCR: NEGATIVE
Staphylococcus aureus: POSITIVE — AB

## 2020-02-14 NOTE — Progress Notes (Signed)
COVID Vaccine Completed:Yes Date COVID Vaccine completed:09/26/19 COVID vaccine manufacturer:  Moderna     PCP - Dr. Anthonette Legato Cardiologist - no  Chest x-ray - no EKG - 02/14/20 Stress Test - no ECHO - no Cardiac Cath - no  Sleep Study - no CPAP -   Fasting Blood Sugar - NA Checks Blood Sugar _____ times a day  Blood Thinner Instructions: ASA/ Dr. Brigitte Pulse Aspirin Instructions: Stop 5 days prior to DOS Last Dose:02/18/20  Anesthesia review:   Patient denies shortness of breath, fever, cough and chest pain at PAT appointment  yes   Patient verbalized understanding of instructions that were given to them at the PAT appointment. Patient was also instructed that they will need to review over the PAT instructions again at home before surgery.  Yes  Pt reports no SOB with housework or climbing stairs. She climbs them one at a time because of her hip and she is active but doesn't work out.  She has a PE after her other hip surgery. Her Dr. Michela Pitcher he will put her am Xarelto after this surgery.

## 2020-02-15 NOTE — H&P (Signed)
TOTAL HIP REVISION ADMISSION H&P  Patient is admitted for right total hip arthroplasty revision.  Subjective:  Chief Complaint: Right hip pain  HPI: The patient is a 69 year old female who presents for pre-operative visit in preparation for their right total hip arthroplasty, which is scheduled on 02/22/2020 with Dr. Wynelle Link at [facility]. The patient has had symptoms in the right hip including pain which has impacted their quality of life and ability to do activities of daily living. The patient currently has a diagnosis of right hip primary osteoarthritis and has failed conservative treatments including activity modification and physical therapy. The patient has had previous total hip arthroplasty on the right hip. Original THA was done in 2014 by Dr. Case in [place], [place]. The patient denies an active infection.  Patient Active Problem List   Diagnosis Date Noted  . OA (osteoarthritis) of hip 11/25/2017  . OBESITY, UNSPECIFIED 10/09/2009  . MYOCARDIAL PERFUSION SCAN, WITH STRESS TEST, ABNORMAL 10/09/2009  . PURE HYPERCHOLESTEROLEMIA 10/08/2009  . HYPERTENSION 10/08/2009  . SHORTNESS OF BREATH 10/08/2009  . CHEST PAIN UNSPECIFIED 10/08/2009    Past Medical History:  Diagnosis Date  . Arthritis    OA  . DJD (degenerative joint disease)    LOWER BACK  . History of kidney stones 2019  . HTN (hypertension)   . Hypercholesterolemia   . Pulmonary embolism ([place]) 03/2013    2 DAYS AFTER AFTER RIGHT HIP REPLACEMENT SURGERY TOOK XARELTO FOR A FEW MONTHS SURGERY WAS CAUSE    Past Surgical History:  Procedure Laterality Date  . ABDOMINAL HYSTERECTOMY  1993   COMPLETE  . chin lift  6-7- YRS AGO  . EYE SURGERY Bilateral 2021  . JOINT REPLACEMENT  03/2013   RIGHT THA POSTERIOR  . TONSILLECTOMY  AS CHILD  . TOTAL HIP ARTHROPLASTY Left 11/25/2017   Procedure: LEFT TOTAL HIP ARTHROPLASTY ANTERIOR APPROACH;  Surgeon: Gaynelle Arabian, MD;  Location: [facility] ORS;  Service: Orthopedics;   Laterality: Left;    Prior to Admission medications   Medication Sig Start Date End Date Taking? Authorizing Provider  acetaminophen (TYLENOL) 500 MG tablet Take 500-1,000 mg by mouth every 6 (six) hours as needed (for pain.).   Yes [provider]  amLODipine (NORVASC) 5 MG tablet Take 5 mg by mouth 2 (two) times daily. 01/02/20  Yes [provider]  amoxicillin (AMOXIL) 500 MG capsule Take 2,000 mg by mouth See admin instructions. Take 4 capsules (2000 mg) by mouth 1 hour prior to dental appointments. 10/05/17  Yes [provider]  aspirin EC 81 MG tablet Take 81 mg by mouth daily. Swallow whole.   Yes [provider]  Eszopiclone 3 MG TABS Take 3 mg by mouth at bedtime. Take immediately before bedtime    Yes [provider]  hydrochlorothiazide (HYDRODIURIL) 25 MG tablet Take 12.5 mg by mouth daily. 01/15/20  Yes [provider]  ibuprofen (ADVIL) 800 MG tablet Take 800 mg by mouth every 8 (eight) hours as needed for pain. 12/29/19  Yes [provider]  losartan (COZAAR) 100 MG tablet Take 100 mg by mouth daily. 01/17/20  Yes [provider]  Multiple Vitamins-Minerals (PRESERVISION AREDS 2 PO) Take 1 tablet by mouth in the morning and at bedtime.   Yes [provider]  pravastatin (PRAVACHOL) 40 MG tablet Take 20 mg by mouth daily.   Yes [provider]    Allergies  Allergen Reactions  . Naproxen Sodium Hives, Shortness Of Breath, Itching and  Rash    Social History   Socioeconomic History  . Marital status: Married    Spouse name: Not on file  . Number of children: 2  . Years of education: Not on file  . Highest education level: Not on file  Occupational History  . Not on file  Tobacco Use  . Smoking status: Never Smoker  . Smokeless tobacco: Never Used  Vaping Use  . Vaping Use: Never used  Substance and Sexual Activity  . Alcohol use: No  . Drug use: No  . Sexual activity: Not on file    Other Topics Concern  . Not on file  Social History Narrative  . Not on file   Social Determinants of Health   Financial Resource Strain:   . Difficulty of Paying Living Expenses:   Food Insecurity:   . Worried About Charity fundraiser in the Last Year:   . Arboriculturist in the Last Year:   Transportation Needs:   . Film/video editor (Medical):   Marland Kitchen Lack of Transportation (Non-Medical):   Physical Activity:   . Days of Exercise per Week:   . Minutes of Exercise per Session:   Stress:   . Feeling of Stress :   Social Connections:   . Frequency of Communication with Friends and Family:   . Frequency of Social Gatherings with Friends and Family:   . Attends Religious Services:   . Active Member of Clubs or Organizations:   . Attends Archivist Meetings:   Marland Kitchen Marital Status:   Intimate Partner Violence:   . Fear of Current or Ex-Partner:   . Emotionally Abused:   Marland Kitchen Physically Abused:   . Sexually Abused:       Tobacco Use: Low Risk   . Smoking Tobacco Use: Never Smoker  . Smokeless Tobacco Use: Never Used   Social History   Substance and Sexual Activity  Alcohol Use No    Family History  Problem Relation Age of Onset  . Cancer Mother   . Coronary artery disease Other     Review of Systems  Constitutional: Negative for chills and fever.  HENT: Negative for congestion, sore throat and tinnitus.   Eyes: Negative for double vision, photophobia and pain.  Respiratory: Negative for cough, shortness of breath and wheezing.   Cardiovascular: Negative for chest pain, palpitations and orthopnea.  Gastrointestinal: Negative for heartburn, nausea and vomiting.  Genitourinary: Negative for dysuria, frequency and urgency.  Musculoskeletal: Positive for joint pain.  Neurological: Negative for dizziness, weakness and headaches.     Objective:  Physical Exam: Well nourished and well developed.  General: Alert and oriented x3, cooperative and pleasant,  no acute distress.  Head: normocephalic, atraumatic, neck supple.  Eyes: EOMI.  Respiratory: breath sounds clear in all fields, no wheezing, rales, or rhonchi. Cardiovascular: Regular rate and rhythm, no murmurs, gallops or rubs.  Abdomen: non-tender to palpation and soft, normoactive bowel sounds. Musculoskeletal:  Right Hip Exam:  The range of motion: Flexion to 90 degrees actively, with passive flexion to 110 degrees with pain. Internal Rotation to 30 degrees, External Rotation to 30 degrees, and abduction to 30 degrees without pain.  There is tenderness over the greater trochanteric bursa. .   Calves soft and nontender. Motor function intact in LE. Strength 5/5 LE bilaterally. Neuro: Distal pulses 2+. Sensation to light touch intact in LE.   Imaging Review Radiographs- AP pelvis, AP and lateral of the right hip dated 01/06/2020 demonstrate  the acetabular component is quite a bit lateralized. I believe her psoas tendon is likely hitting the inferior part of the component, and that is what is causing her groin pain and inability to flex the hip.   Assessment/Plan:  Painful right total hip arthroplasty  The patient history, physical examination, clinical judgement of the provider and imaging studies are consistent with failed right total hip arthroplasty and total hip arthroplasty revision is deemed medically necessary. The treatment options including medical management, and arthroplasty were discussed at length. The risks and benefits of total hip arthroplasty revision were presented and reviewed. The risks due to aseptic loosening, infection, stiffness, dislocation/subluxation, thromboembolic complications and other imponderables were discussed. The patient acknowledged the explanation, agreed to proceed with the plan and consent was signed. Patient is being admitted for inpatient treatment for surgery, pain control, PT, OT, prophylactic antibiotics, VTE prophylaxis, progressive ambulation  and ADLs and discharge planning.The patient is planning to be discharged home.  Anticipated LOS equal to or greater than 2 midnights due to - Age 72 and older with one or more of the following:  - Obesity  - Expected need for hospital services (PT, OT, Nursing) required for safe  discharge  - Anticipated need for postoperative skilled nursing care or inpatient rehab  - Active co-morbidities: DVT/VTE OR   - Unanticipated findings during/Post Surgery: None  - Patient is a high risk of re-admission due to: None  Therapy Plans: HEP Disposition: Home with husband Planned DVT Prophylaxis: Xarelto 10 mg QD  DME Needed: None PCP: Monico Blitz, MD (clearance received) TXA: Topical  Allergies: Aleve (itching) Anesthesia Concerns: None BMI: 31.7 Last HgbA1c: Not diabetic.  Other: - Hx of PE  - Patient was instructed on what medications to stop prior to surgery. - Follow-up visit in 2 weeks with Dr. Wynelle Link - Begin physical therapy following surgery - Pre-operative lab work as pre-surgical testing - Prescriptions will be provided in hospital at time of discharge  Theresa Duty, PA-C Orthopedic Surgery [facility] [place]

## 2020-02-18 ENCOUNTER — Other Ambulatory Visit (HOSPITAL_COMMUNITY)
Admission: RE | Admit: 2020-02-18 | Discharge: 2020-02-18 | Disposition: A | Discharge status: Home / Self Care | Payer: Medicare Other | Source: Ambulatory Visit | Attending: Orthopedic Surgery | Admitting: Orthopedic Surgery

## 2020-02-18 DIAGNOSIS — Z01812 Encounter for preprocedural laboratory examination: Secondary | ICD-10-CM | POA: Diagnosis not present

## 2020-02-18 DIAGNOSIS — Z20822 Contact with and (suspected) exposure to covid-19: Secondary | ICD-10-CM | POA: Insufficient documentation

## 2020-02-18 LAB — SARS CORONAVIRUS 2 (TAT 6-24 HRS): SARS Coronavirus 2: NEGATIVE

## 2020-02-21 MED ORDER — TRANEXAMIC ACID 1000 MG/10ML IV SOLN
2000.0000 mg | Freq: Once | INTRAVENOUS | Status: AC
  Filled 2020-02-21: qty 20

## 2020-02-21 NOTE — Progress Notes (Signed)
Pt notified of surgery time change.

## 2020-02-22 ENCOUNTER — Encounter (HOSPITAL_COMMUNITY): Payer: Self-pay | Admitting: Orthopedic Surgery

## 2020-02-22 ENCOUNTER — Other Ambulatory Visit: Payer: Self-pay

## 2020-02-22 ENCOUNTER — Observation Stay (HOSPITAL_COMMUNITY)
Admission: RE | Admit: 2020-02-22 | Discharge: 2020-02-23 | Disposition: A | Discharge status: Home / Self Care | Payer: Medicare Other | Source: Other Acute Inpatient Hospital | Attending: Orthopedic Surgery | Admitting: Orthopedic Surgery

## 2020-02-22 ENCOUNTER — Encounter (HOSPITAL_COMMUNITY)
Admission: RE | Disposition: A | Discharge status: Home / Self Care | Payer: Self-pay | Source: Other Acute Inpatient Hospital | Attending: Orthopedic Surgery

## 2020-02-22 ENCOUNTER — Inpatient Hospital Stay (HOSPITAL_COMMUNITY): Payer: Medicare Other

## 2020-02-22 ENCOUNTER — Inpatient Hospital Stay (HOSPITAL_COMMUNITY): Payer: Medicare Other | Admitting: Anesthesiology

## 2020-02-22 DIAGNOSIS — Z7982 Long term (current) use of aspirin: Secondary | ICD-10-CM | POA: Diagnosis not present

## 2020-02-22 DIAGNOSIS — Z96641 Presence of right artificial hip joint: Secondary | ICD-10-CM | POA: Diagnosis not present

## 2020-02-22 DIAGNOSIS — M25551 Pain in right hip: Secondary | ICD-10-CM | POA: Diagnosis present

## 2020-02-22 DIAGNOSIS — T84090A Other mechanical complication of internal right hip prosthesis, initial encounter: Secondary | ICD-10-CM | POA: Diagnosis not present

## 2020-02-22 DIAGNOSIS — Z96642 Presence of left artificial hip joint: Secondary | ICD-10-CM

## 2020-02-22 DIAGNOSIS — Z471 Aftercare following joint replacement surgery: Secondary | ICD-10-CM | POA: Diagnosis not present

## 2020-02-22 DIAGNOSIS — Z96649 Presence of unspecified artificial hip joint: Secondary | ICD-10-CM

## 2020-02-22 DIAGNOSIS — T8484XA Pain due to internal orthopedic prosthetic devices, implants and grafts, initial encounter: Secondary | ICD-10-CM | POA: Diagnosis not present

## 2020-02-22 DIAGNOSIS — I10 Essential (primary) hypertension: Secondary | ICD-10-CM | POA: Insufficient documentation

## 2020-02-22 DIAGNOSIS — E7800 Pure hypercholesterolemia, unspecified: Secondary | ICD-10-CM | POA: Diagnosis not present

## 2020-02-22 HISTORY — PX: TOTAL HIP REVISION: SHX763

## 2020-02-22 LAB — TYPE AND SCREEN
ABO/RH(D): A POS
Antibody Screen: NEGATIVE

## 2020-02-22 IMAGING — DX DG PELVIS 1-2V
1 series · 1 of 1 positions shown · non-contrast
Comparison: CT abdomen pelvis dated [DATE].

CLINICAL DATA: Right hip arthroplasty revision.

EXAM:
PELVIS - 1-2 VIEW

[pelvis ap]
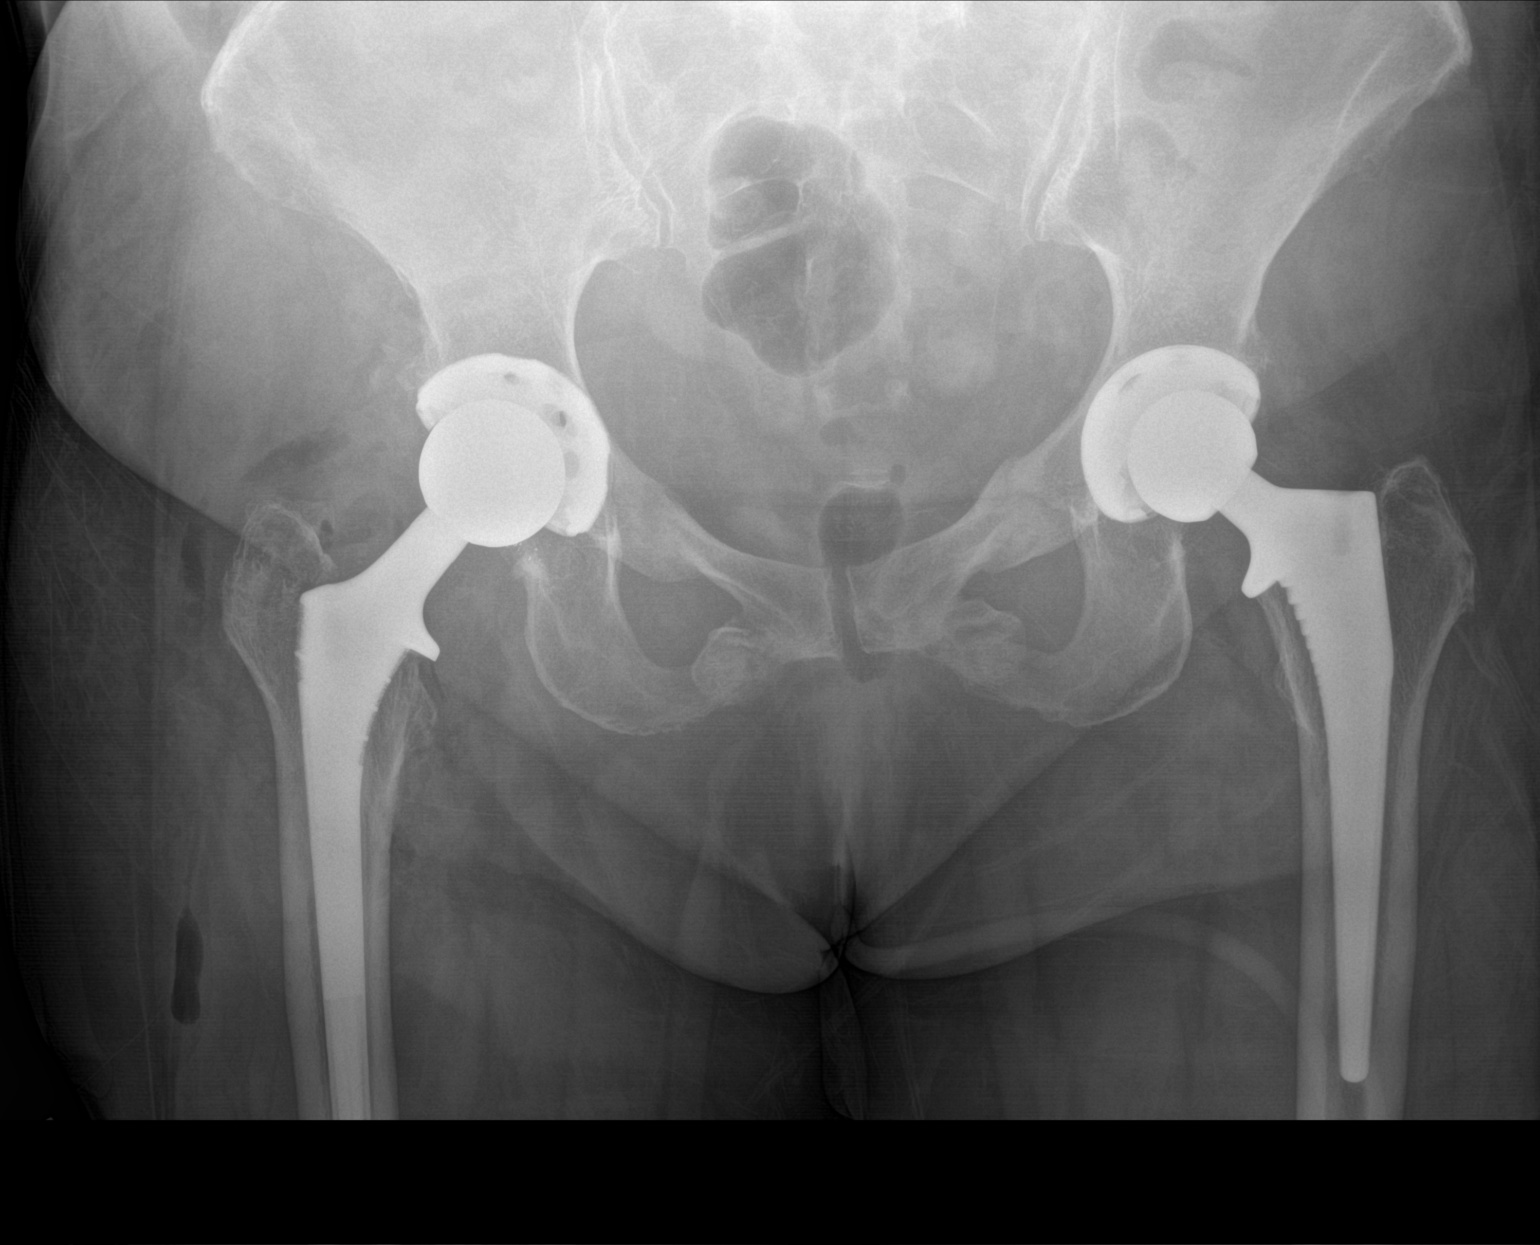

[1 of 1 positions shown; findings below may reference images not displayed]

FINDINGS: The right hip demonstrates a total arthroplasty revision without
evidence of hardware failure or complication. There is no do
fracture or dislocation. Old healed fractures of the bilateral pubic
rami, new since the prior study. The alignment is anatomic.
Post-surgical changes noted in the surrounding soft tissues. Prior
left total hip arthroplasty without hardware complication.
IMPRESSION: 1. Interval right total hip arthroplasty revision without acute
postoperative complication.
2. Left total hip arthroplasty without hardware complication.
3. Old healed bilateral pubic rami fractures, new since the prior
study.

## 2020-02-22 SURGERY — TOTAL HIP REVISION
Anesthesia: Spinal | Site: Hip | Laterality: Right

## 2020-02-22 MED ORDER — MORPHINE SULFATE (PF) 2 MG/ML IV SOLN: 0.5000 mg | INTRAVENOUS | Status: DC | PRN

## 2020-02-22 MED ORDER — RIVAROXABAN 10 MG PO TABS
10.0000 mg | ORAL_TABLET | Freq: Every day | ORAL | Status: DC
  Administered 2020-02-23: 10 mg via ORAL
  Filled 2020-02-22: qty 1

## 2020-02-22 MED ORDER — ACETAMINOPHEN 10 MG/ML IV SOLN
INTRAVENOUS | Status: AC
  Filled 2020-02-22: qty 100

## 2020-02-22 MED ORDER — SODIUM CHLORIDE 0.9 % IR SOLN
Status: DC | PRN
  Administered 2020-02-22: 1000 mL

## 2020-02-22 MED ORDER — BISACODYL 10 MG RE SUPP: 10.0000 mg | Freq: Every day | RECTAL | Status: DC | PRN

## 2020-02-22 MED ORDER — HYDROCHLOROTHIAZIDE 25 MG PO TABS
12.5000 mg | ORAL_TABLET | Freq: Every day | ORAL | Status: DC
  Filled 2020-02-22: qty 1

## 2020-02-22 MED ORDER — HYDROMORPHONE HCL 1 MG/ML IJ SOLN
0.2500 mg | INTRAMUSCULAR | Status: DC | PRN
  Administered 2020-02-22 (×2): 0.5 mg via INTRAVENOUS

## 2020-02-22 MED ORDER — HYDROMORPHONE HCL 1 MG/ML IJ SOLN
INTRAMUSCULAR | Status: AC
  Filled 2020-02-22: qty 1

## 2020-02-22 MED ORDER — AMLODIPINE BESYLATE 5 MG PO TABS
5.0000 mg | ORAL_TABLET | Freq: Two times a day (BID) | ORAL | Status: DC
  Administered 2020-02-22: 5 mg via ORAL
  Filled 2020-02-22 (×2): qty 1

## 2020-02-22 MED ORDER — ORAL CARE MOUTH RINSE: 15.0000 mL | Freq: Once | OROMUCOSAL | Status: AC

## 2020-02-22 MED ORDER — BUPIVACAINE HCL 0.25 % IJ SOLN
INTRAMUSCULAR | Status: AC
  Filled 2020-02-22: qty 1

## 2020-02-22 MED ORDER — CEFAZOLIN SODIUM-DEXTROSE 2-4 GM/100ML-% IV SOLN
2.0000 g | INTRAVENOUS | Status: AC
Start: 2020-02-22 — End: 2020-02-22
  Administered 2020-02-22: 2 g via INTRAVENOUS

## 2020-02-22 MED ORDER — PROPOFOL 10 MG/ML IV BOLUS
INTRAVENOUS | Status: AC
  Filled 2020-02-22: qty 20

## 2020-02-22 MED ORDER — DOCUSATE SODIUM 100 MG PO CAPS
100.0000 mg | ORAL_CAPSULE | Freq: Two times a day (BID) | ORAL | Status: DC
  Administered 2020-02-22 – 2020-02-23 (×2): 100 mg via ORAL
  Filled 2020-02-22 (×2): qty 1

## 2020-02-22 MED ORDER — ALUM & MAG HYDROXIDE-SIMETH 200-200-20 MG/5ML PO SUSP: 30.0000 mL | ORAL | Status: DC | PRN

## 2020-02-22 MED ORDER — METHOCARBAMOL 500 MG IVPB - SIMPLE MED
INTRAVENOUS | Status: AC
  Administered 2020-02-22: 500 mg via INTRAVENOUS
  Filled 2020-02-22: qty 50

## 2020-02-22 MED ORDER — METOCLOPRAMIDE HCL 5 MG PO TABS: 5.0000 mg | ORAL_TABLET | Freq: Three times a day (TID) | ORAL | Status: DC | PRN

## 2020-02-22 MED ORDER — METHOCARBAMOL 500 MG PO TABS
500.0000 mg | ORAL_TABLET | Freq: Four times a day (QID) | ORAL | Status: DC | PRN
  Administered 2020-02-22 – 2020-02-23 (×2): 500 mg via ORAL
  Filled 2020-02-22 (×2): qty 1

## 2020-02-22 MED ORDER — ONDANSETRON HCL 4 MG/2ML IJ SOLN
INTRAMUSCULAR | Status: AC
  Filled 2020-02-22: qty 2

## 2020-02-22 MED ORDER — STERILE WATER FOR IRRIGATION IR SOLN
Status: DC | PRN
  Administered 2020-02-22 (×2): 1000 mL

## 2020-02-22 MED ORDER — ONDANSETRON HCL 4 MG/2ML IJ SOLN
INTRAMUSCULAR | Status: DC | PRN
  Administered 2020-02-22: 4 mg via INTRAVENOUS

## 2020-02-22 MED ORDER — MIDAZOLAM HCL 5 MG/5ML IJ SOLN
INTRAMUSCULAR | Status: DC | PRN
  Administered 2020-02-22: 2 mg via INTRAVENOUS

## 2020-02-22 MED ORDER — LOSARTAN POTASSIUM 50 MG PO TABS
100.0000 mg | ORAL_TABLET | Freq: Every day | ORAL | Status: DC
  Filled 2020-02-22: qty 2

## 2020-02-22 MED ORDER — PHENOL 1.4 % MT LIQD: 1.0000 | OROMUCOSAL | Status: DC | PRN

## 2020-02-22 MED ORDER — ZOLPIDEM TARTRATE 5 MG PO TABS: 5.0000 mg | ORAL_TABLET | Freq: Every evening | ORAL | Status: DC | PRN

## 2020-02-22 MED ORDER — PRAVASTATIN SODIUM 20 MG PO TABS
20.0000 mg | ORAL_TABLET | Freq: Every day | ORAL | Status: DC
  Administered 2020-02-23: 20 mg via ORAL
  Filled 2020-02-22: qty 1

## 2020-02-22 MED ORDER — EPHEDRINE 5 MG/ML INJ
INTRAVENOUS | Status: AC
  Filled 2020-02-22: qty 10

## 2020-02-22 MED ORDER — FENTANYL CITRATE (PF) 100 MCG/2ML IJ SOLN
INTRAMUSCULAR | Status: AC
  Filled 2020-02-22: qty 2

## 2020-02-22 MED ORDER — CEFAZOLIN SODIUM-DEXTROSE 2-4 GM/100ML-% IV SOLN
INTRAVENOUS | Status: AC
  Filled 2020-02-22: qty 100

## 2020-02-22 MED ORDER — LACTATED RINGERS IV SOLN: INTRAVENOUS | Status: DC

## 2020-02-22 MED ORDER — POLYETHYLENE GLYCOL 3350 17 G PO PACK: 17.0000 g | PACK | Freq: Every day | ORAL | Status: DC | PRN

## 2020-02-22 MED ORDER — METHOCARBAMOL 500 MG IVPB - SIMPLE MED
500.0000 mg | Freq: Four times a day (QID) | INTRAVENOUS | Status: DC | PRN
  Filled 2020-02-22: qty 50

## 2020-02-22 MED ORDER — DEXAMETHASONE SODIUM PHOSPHATE 10 MG/ML IJ SOLN
INTRAMUSCULAR | Status: AC
  Filled 2020-02-22: qty 1

## 2020-02-22 MED ORDER — HYDROCODONE-ACETAMINOPHEN 5-325 MG PO TABS
1.0000 | ORAL_TABLET | ORAL | Status: DC | PRN
  Administered 2020-02-22 – 2020-02-23 (×2): 1 via ORAL
  Filled 2020-02-22 (×2): qty 1

## 2020-02-22 MED ORDER — CHLORHEXIDINE GLUCONATE 0.12 % MT SOLN
15.0000 mL | Freq: Once | OROMUCOSAL | Status: AC
  Administered 2020-02-22: 15 mL via OROMUCOSAL

## 2020-02-22 MED ORDER — PHENYLEPHRINE HCL (PRESSORS) 10 MG/ML IV SOLN
INTRAVENOUS | Status: DC | PRN
  Administered 2020-02-22: 80 ug via INTRAVENOUS

## 2020-02-22 MED ORDER — SODIUM CHLORIDE 0.9 % IV SOLN
INTRAVENOUS | Status: DC
  Administered 2020-02-22: 1000 mL via INTRAVENOUS

## 2020-02-22 MED ORDER — DIPHENHYDRAMINE HCL 12.5 MG/5ML PO ELIX: 12.5000 mg | ORAL_SOLUTION | ORAL | Status: DC | PRN

## 2020-02-22 MED ORDER — DEXAMETHASONE SODIUM PHOSPHATE 10 MG/ML IJ SOLN
8.0000 mg | Freq: Once | INTRAMUSCULAR | Status: AC
  Administered 2020-02-22: 10 mg via INTRAVENOUS

## 2020-02-22 MED ORDER — ACETAMINOPHEN 500 MG PO TABS
500.0000 mg | ORAL_TABLET | Freq: Four times a day (QID) | ORAL | Status: DC
  Administered 2020-02-22 – 2020-02-23 (×3): 500 mg via ORAL
  Filled 2020-02-22 (×3): qty 1

## 2020-02-22 MED ORDER — PHENYLEPHRINE 40 MCG/ML (10ML) SYRINGE FOR IV PUSH (FOR BLOOD PRESSURE SUPPORT)
PREFILLED_SYRINGE | INTRAVENOUS | Status: AC
  Filled 2020-02-22: qty 10

## 2020-02-22 MED ORDER — ONDANSETRON HCL 4 MG/2ML IJ SOLN: 4.0000 mg | Freq: Four times a day (QID) | INTRAMUSCULAR | Status: DC | PRN

## 2020-02-22 MED ORDER — BUPIVACAINE IN DEXTROSE 0.75-8.25 % IT SOLN
INTRATHECAL | Status: DC | PRN
Start: 2020-02-22 — End: 2020-02-22
  Administered 2020-02-22: 2 mL via INTRATHECAL

## 2020-02-22 MED ORDER — PROPOFOL 500 MG/50ML IV EMUL
INTRAVENOUS | Status: DC | PRN
  Administered 2020-02-22: 100 ug/kg/min via INTRAVENOUS

## 2020-02-22 MED ORDER — CEFAZOLIN SODIUM-DEXTROSE 2-4 GM/100ML-% IV SOLN
2.0000 g | Freq: Four times a day (QID) | INTRAVENOUS | Status: AC
  Administered 2020-02-22 – 2020-02-23 (×2): 2 g via INTRAVENOUS
  Filled 2020-02-22 (×2): qty 100

## 2020-02-22 MED ORDER — DEXAMETHASONE SODIUM PHOSPHATE 10 MG/ML IJ SOLN
10.0000 mg | Freq: Once | INTRAMUSCULAR | Status: AC
  Administered 2020-02-23: 10 mg via INTRAVENOUS
  Filled 2020-02-22: qty 1

## 2020-02-22 MED ORDER — PHENYLEPHRINE HCL-NACL 10-0.9 MG/250ML-% IV SOLN
INTRAVENOUS | Status: DC | PRN
Start: 2020-02-22 — End: 2020-02-22
  Administered 2020-02-22: 50 ug/min via INTRAVENOUS

## 2020-02-22 MED ORDER — MAGNESIUM CITRATE PO SOLN: 1.0000 | Freq: Once | ORAL | Status: DC | PRN

## 2020-02-22 MED ORDER — PHENYLEPHRINE HCL (PRESSORS) 10 MG/ML IV SOLN
INTRAVENOUS | Status: AC
  Filled 2020-02-22: qty 1

## 2020-02-22 MED ORDER — MIDAZOLAM HCL 2 MG/2ML IJ SOLN
INTRAMUSCULAR | Status: AC
  Filled 2020-02-22: qty 2

## 2020-02-22 MED ORDER — HYDROMORPHONE HCL 1 MG/ML IJ SOLN
INTRAMUSCULAR | Status: AC
  Administered 2020-02-22: 0.5 mg via INTRAVENOUS
  Filled 2020-02-22: qty 1

## 2020-02-22 MED ORDER — EPHEDRINE SULFATE 50 MG/ML IJ SOLN
INTRAMUSCULAR | Status: DC | PRN
Start: 2020-02-22 — End: 2020-02-22
  Administered 2020-02-22: 10 mg via INTRAVENOUS

## 2020-02-22 MED ORDER — FENTANYL CITRATE (PF) 100 MCG/2ML IJ SOLN
INTRAMUSCULAR | Status: DC | PRN
  Administered 2020-02-22 (×2): 50 ug via INTRAVENOUS

## 2020-02-22 MED ORDER — METOCLOPRAMIDE HCL 5 MG/ML IJ SOLN: 5.0000 mg | Freq: Three times a day (TID) | INTRAMUSCULAR | Status: DC | PRN

## 2020-02-22 MED ORDER — MENTHOL 3 MG MT LOZG: 1.0000 | LOZENGE | OROMUCOSAL | Status: DC | PRN

## 2020-02-22 MED ORDER — TRAMADOL HCL 50 MG PO TABS
50.0000 mg | ORAL_TABLET | Freq: Four times a day (QID) | ORAL | Status: DC | PRN
  Administered 2020-02-22: 50 mg via ORAL
  Administered 2020-02-23 (×3): 100 mg via ORAL
  Filled 2020-02-22 (×3): qty 2
  Filled 2020-02-22: qty 1

## 2020-02-22 MED ORDER — PROPOFOL 10 MG/ML IV BOLUS
INTRAVENOUS | Status: DC | PRN
  Administered 2020-02-22 (×2): 50 mg via INTRAVENOUS
  Administered 2020-02-22: 20 mg via INTRAVENOUS

## 2020-02-22 MED ORDER — ACETAMINOPHEN 325 MG PO TABS: 325.0000 mg | ORAL_TABLET | Freq: Four times a day (QID) | ORAL | Status: DC | PRN

## 2020-02-22 MED ORDER — ONDANSETRON HCL 4 MG PO TABS: 4.0000 mg | ORAL_TABLET | Freq: Four times a day (QID) | ORAL | Status: DC | PRN

## 2020-02-22 MED ORDER — ACETAMINOPHEN 10 MG/ML IV SOLN
1000.0000 mg | Freq: Four times a day (QID) | INTRAVENOUS | Status: DC
  Administered 2020-02-22: 1000 mg via INTRAVENOUS

## 2020-02-22 MED ORDER — PROPOFOL 1000 MG/100ML IV EMUL
INTRAVENOUS | Status: AC
  Filled 2020-02-22: qty 100

## 2020-02-22 MED ORDER — POVIDONE-IODINE 10 % EX SWAB
2.0000 "application " | Freq: Once | CUTANEOUS | Status: AC
  Administered 2020-02-22: 2 via TOPICAL

## 2020-02-22 SURGICAL SUPPLY — 67 items
BAG DECANTER FOR FLEXI CONT (MISCELLANEOUS) ×3 IMPLANT
BAG SPEC THK2 15X12 ZIP CLS (MISCELLANEOUS) ×2
BAG ZIPLOCK 12X15 (MISCELLANEOUS) ×6 IMPLANT
BIT DRILL 2.8X128 (BIT) ×2 IMPLANT
BIT DRILL 2.8X128MM (BIT) ×1
BLADE SAW SAG 73X25 THK (BLADE)
BLADE SAW SGTL 73X25 THK (BLADE) IMPLANT
CLOSURE STERI-STRIP 1/2X4 (GAUZE/BANDAGES/DRESSINGS) ×1
CLSR STERI-STRIP ANTIMIC 1/2X4 (GAUZE/BANDAGES/DRESSINGS) ×1 IMPLANT
COVER SURGICAL LIGHT HANDLE (MISCELLANEOUS) ×3 IMPLANT
COVER WAND RF STERILE (DRAPES) IMPLANT
CUP ACETAB PIN MULTI 54MM (Orthopedic Implant) ×2 IMPLANT
DRAPE INCISE IOBAN 66X45 STRL (DRAPES) ×3 IMPLANT
DRAPE ORTHO SPLIT 77X108 STRL (DRAPES) ×6
DRAPE POUCH INSTRU U-SHP 10X18 (DRAPES) ×3 IMPLANT
DRAPE SURG ORHT 6 SPLT 77X108 (DRAPES) ×2 IMPLANT
DRAPE U-SHAPE 47X51 STRL (DRAPES) ×3 IMPLANT
DRSG AQUACEL AG ADV 3.5X14 (GAUZE/BANDAGES/DRESSINGS) ×2 IMPLANT
DRSG EMULSION OIL 3X16 NADH (GAUZE/BANDAGES/DRESSINGS) ×3 IMPLANT
DRSG MEPILEX BORDER 4X4 (GAUZE/BANDAGES/DRESSINGS) ×6 IMPLANT
DRSG MEPILEX BORDER 4X8 (GAUZE/BANDAGES/DRESSINGS) ×3 IMPLANT
DURAPREP 26ML APPLICATOR (WOUND CARE) ×3 IMPLANT
ELECT REM PT RETURN 15FT ADLT (MISCELLANEOUS) ×3 IMPLANT
EVACUATOR 1/8 PVC DRAIN (DRAIN) ×3 IMPLANT
FACESHIELD WRAPAROUND (MASK) ×12 IMPLANT
FACESHIELD WRAPAROUND OR TEAM (MASK) ×4 IMPLANT
GAUZE SPONGE 4X4 12PLY STRL (GAUZE/BANDAGES/DRESSINGS) ×3 IMPLANT
GLOVE BIO SURGEON STRL SZ7 (GLOVE) ×3 IMPLANT
GLOVE BIO SURGEON STRL SZ8 (GLOVE) ×3 IMPLANT
GLOVE BIOGEL PI IND STRL 7.0 (GLOVE) ×1 IMPLANT
GLOVE BIOGEL PI IND STRL 8 (GLOVE) ×1 IMPLANT
GLOVE BIOGEL PI INDICATOR 7.0 (GLOVE) ×2
GLOVE BIOGEL PI INDICATOR 8 (GLOVE) ×2
GOWN STRL REUS W/TWL LRG LVL3 (GOWN DISPOSABLE) ×6 IMPLANT
HANDPIECE INTERPULSE COAX TIP (DISPOSABLE)
HEAD FEMUR METAL 36MM 15.6 HIP (Head) IMPLANT
IMMOBILIZER KNEE 20 (SOFTGOODS)
IMMOBILIZER KNEE 20 THIGH 36 (SOFTGOODS) IMPLANT
KIT BASIN OR (CUSTOM PROCEDURE TRAY) ×3 IMPLANT
KIT TURNOVER KIT A (KITS) IMPLANT
LINER MARATHON NEUT +4X54X36 (Hips) ×2 IMPLANT
MANIFOLD NEPTUNE II (INSTRUMENTS) ×3 IMPLANT
METAL FEMUR HEAD 36MM 15.6 HIP (Head) ×3 IMPLANT
NDL SAFETY ECLIPSE 18X1.5 (NEEDLE) ×1 IMPLANT
NEEDLE HYPO 18GX1.5 SHARP (NEEDLE) ×3
NS IRRIG 1000ML POUR BTL (IV SOLUTION) ×3 IMPLANT
PACK TOTAL JOINT (CUSTOM PROCEDURE TRAY) ×3 IMPLANT
PASSER SUT SWANSON 36MM LOOP (INSTRUMENTS) ×2 IMPLANT
PENCIL SMOKE EVACUATOR COATED (MISCELLANEOUS) ×3 IMPLANT
PROTECTOR NERVE ULNAR (MISCELLANEOUS) ×3 IMPLANT
SET HNDPC FAN SPRY TIP SCT (DISPOSABLE) IMPLANT
SPONGE LAP 18X18 RF (DISPOSABLE) IMPLANT
STAPLER VISISTAT 35W (STAPLE) IMPLANT
SUCTION FRAZIER HANDLE 12FR (TUBING) ×3
SUCTION TUBE FRAZIER 12FR DISP (TUBING) ×1 IMPLANT
SUT ETHIBOND NAB CT1 #1 30IN (SUTURE) ×6 IMPLANT
SUT STRATAFIX 0 PDS 27 VIOLET (SUTURE) ×3
SUT VIC AB 2-0 CT1 27 (SUTURE) ×9
SUT VIC AB 2-0 CT1 TAPERPNT 27 (SUTURE) ×3 IMPLANT
SUTURE STRATFX 0 PDS 27 VIOLET (SUTURE) ×1 IMPLANT
SWAB COLLECTION DEVICE MRSA (MISCELLANEOUS) IMPLANT
SWAB CULTURE ESWAB REG 1ML (MISCELLANEOUS) IMPLANT
SYR 50ML LL SCALE MARK (SYRINGE) ×3 IMPLANT
TOWEL OR 17X26 10 PK STRL BLUE (TOWEL DISPOSABLE) ×6 IMPLANT
TRAY FOLEY MTR SLVR 16FR STAT (SET/KITS/TRAYS/PACK) ×3 IMPLANT
WATER STERILE IRR 1000ML POUR (IV SOLUTION) ×6 IMPLANT
YANKAUER SUCT BULB TIP 10FT TU (MISCELLANEOUS) ×3 IMPLANT

## 2020-02-22 NOTE — Anesthesia Preprocedure Evaluation (Signed)
Anesthesia Evaluation  Patient identified by MRN, date of birth, ID band Patient awake    Reviewed: Allergy & Precautions, NPO status , Patient's Chart, lab work & pertinent test results  Airway Mallampati: II  TM Distance: >3 FB Neck ROM: Full    Dental no notable dental hx.    Pulmonary neg pulmonary ROS,    Pulmonary exam normal breath sounds clear to auscultation       Cardiovascular hypertension, Pt. on medications Normal cardiovascular exam Rhythm:Regular Rate:Normal     Neuro/Psych negative neurological ROS  negative psych ROS   GI/Hepatic negative GI ROS, Neg liver ROS,   Endo/Other  negative endocrine ROS  Renal/GU negative Renal ROS  negative genitourinary   Musculoskeletal  (+) Arthritis , Osteoarthritis,    Abdominal   Peds negative pediatric ROS (+)  Hematology negative hematology ROS (+)   Anesthesia Other Findings   Reproductive/Obstetrics negative OB ROS                             Anesthesia Physical Anesthesia Plan  ASA: II  Anesthesia Plan: Spinal   Post-op Pain Management:    Induction: Intravenous  PONV Risk Score and Plan: 2 and Ondansetron, Dexamethasone and Treatment may vary due to age or medical condition  Airway Management Planned: Simple Face Mask  Additional Equipment:   Intra-op Plan:   Post-operative Plan:   Informed Consent: I have reviewed the patients History and Physical, chart, labs and discussed the procedure including the risks, benefits and alternatives for the proposed anesthesia with the patient or authorized representative who has indicated his/her understanding and acceptance.     Dental advisory given  Plan Discussed with: CRNA and Surgeon  Anesthesia Plan Comments:         Anesthesia Quick Evaluation

## 2020-02-22 NOTE — Anesthesia Procedure Notes (Addendum)
Spinal  Patient location during procedure: OR Start time: 02/22/2020 11:19 AM End time: 02/22/2020 11:23 AM Staffing Performed: anesthesiologist  Anesthesiologist: Myrtie Soman, MD Preanesthetic Checklist Completed: patient identified, IV checked, site marked, risks and benefits discussed, surgical consent, monitors and equipment checked, pre-op evaluation and timeout performed Spinal Block Patient position: sitting Prep: DuraPrep Patient monitoring: heart rate, cardiac monitor, continuous pulse ox and blood pressure Approach: midline Location: L3-4 Injection technique: single-shot Needle Needle type: Sprotte  Needle gauge: 24 G Needle length: 9 cm Assessment Sensory level: T4

## 2020-02-22 NOTE — Brief Op Note (Signed)
02/22/2020  1:04 PM  PATIENT:  Christina Clay  69 y.o. female  PRE-OPERATIVE DIAGNOSIS:  Failed right total hip arthroplasty  POST-OPERATIVE DIAGNOSIS:  Failed right total hip arthroplasty  PROCEDURE:  Procedure(s) with comments: ACETABULAR  REVISION (Right) - 166min  SURGEON:  Surgeon(s) and Role:    Gaynelle Arabian, MD - Primary  PHYSICIAN ASSISTANT:   ASSISTANTS: Griffith Citron, PA-C   ANESTHESIA:   spinal  EBL:  500 mL   BLOOD ADMINISTERED:none  DRAINS: none   LOCAL MEDICATIONS USED:  MARCAINE     COUNTS:  YES  TOURNIQUET:  * No tourniquets in log *  DICTATION: .Other Dictation: Dictation Number [id number]  PLAN OF CARE: Admit to inpatient   PATIENT DISPOSITION:  PACU - hemodynamically stable.

## 2020-02-22 NOTE — Anesthesia Procedure Notes (Signed)
Procedure Name: MAC Date/Time: 02/22/2020 11:12 AM Performed by: Lissa Morales, CRNA Pre-anesthesia Checklist: Patient identified, Emergency Drugs available, Suction available, Patient being monitored and Timeout performed Patient Re-evaluated:Patient Re-evaluated prior to induction Oxygen Delivery Method: Simple face mask

## 2020-02-22 NOTE — Op Note (Signed)
NAME: Christina Clay, Christina Clay MEDICAL RECORD WI:[id number] ACCOUNT [id number] DATE OF BIRTH:04/25/1951 FACILITY: [facility] LOCATION: [facility] PHYSICIAN:Nisha Dhami Zella Ball, MD  OPERATIVE REPORT  DATE OF PROCEDURE:  02/22/2020  PREOPERATIVE DIAGNOSIS:  Failed right total hip arthroplasty.  POSTOPERATIVE DIAGNOSIS:  Failed right total hip arthroplasty.  PROCEDURE:  Right acetabular revision.  SURGEON:  Gaynelle Arabian, MD  ASSISTANT:  Griffith Citron, PA-C  ANESTHESIA:  Spinal.  ESTIMATED BLOOD LOSS:  500 mL.  DRAINS:  Hemovac x1.  COMPLICATIONS:  None.  CONDITION:  Stable to recovery.  BRIEF CLINICAL NOTE:  The patient is a 69 year old female with a severely painful right total hip arthroplasty done several years ago.  She has had extensive workup showing no evidence of loosening and no evidence of infection.  She has also had her back  evaluated showing no evidence of a lumbar etiology for this.  Her x-rays do show her acetabular component is significantly lateralized with significant overhang of the component over the bony margins.  Thus, I feel the problem is secondary to  impingement.  She presents now for acetabular versus total hip revision.  We discussed that we would revise the acetabulum and if we can gain a stable construct with her current femoral stem and if it was stable in the bone, that we would not need to  revise the femur.  If indeed she had a loose component or we could not achieve implants stability, then we would have to revise both components.  PROCEDURE IN DETAIL:  After successful administration of spinal anesthetic, the patient was placed in the left lateral decubitus position with the right side up and held with a hip positioner.  Right lower extremity was isolated from her perineum with  plastic drapes and prepped and draped in the usual sterile fashion.  Posterolateral incision was made with a 10 blade through subcutaneous tissue to the fascia lata, which was incised in  line with the skin incision.  We did not encounter any fluid or  abnormally stained tissue.  The sciatic nerve was palpated and protected and then the posterior pseudocapsule was incised and elevated off the femur to gain access to the joint.  The hip was dislocated and the femoral head was removed.  Fortunately, the  femoral head is 12/14 taper for the femoral neck, which matches the taper of most other prosthetic implants.  The femur was inspected.  It was in excellent position with excellent anteversion.  There were no signs of any definitive loosening.  I then  used flexible osteotomes proximally to test the stability and I did disrupt about an inch circumferentially between the bone and implant, but the implant was rigidly fixed and I felt it would have required an extended femoral osteotomy to remove it.   Thus, we kept it in place while addressing the acetabulum.  The femur was retracted anteriorly to gain acetabular exposure.  The acetabular retractors were placed.  The acetabular component was lateralized significantly and there was a significant protrusion down posteriorly and inferiorly to the point where the  flexor tendon was impinging against that portion of the cup.  I removed the acetabular liner and then there was a screw present in the socket, which was also removed.  We then used the [place] acetabular osteotomes to disrupt the interface between the  acetabular component and bone.  The component was removed with minimal to no bone loss.  It was a  54 mm shell.  I needed to ream more medially and I  reamed down to the acetabular teardrop with a 49 and increased up to 53 mm for placement of a 54 mm  Pinnacle multihole Gription cup.  The cup was impacted into the acetabulum in anatomic position with outstanding fit.  It was very stable.  I did not need to place any additional dome screws.  I tested the position with the acetabular positioner and it  was about 20 degrees anteversion and 45  degrees of abduction.  I was very pleased with the position.  It did not have any component overhang.  It was all imbedded within the bone.  The 36 mm neutral +4 Marathon liner was placed.  We again addressed the  femur.  Once again, I was unable to disrupt the bond between the femoral component and bone.  It was felt to be very stable.  I tried a 36+5 head.  We needed more soft tissue tension, so we went to 36+12.5 head which upon reduction had excellent  stability, but the leg was still slightly short.  A 36+15 metal head was then placed.  Hip was reduced with outstanding stability.  There was full extension, full external rotation, 70 degrees flexion, 40 degrees adduction, 90 degrees, internal rotation  and 90 degrees of flexion and 70 degrees of internal rotation.  By placing the right leg on top of the left, it felt as though the leg lengths were equal.  The hip was then dislocated, the permanent head placed with the same stability parameters.  Wound  copiously irrigated with saline solution and the short rotators were reattached to the femur through drill holes with Ethibond suture.  Fascia lata was closed with running 0 Stratafix suture.  Subcutaneous was closed with interrupted 2-0 Vicryl and  subcuticular running 4-0 Monocryl.  Incisions cleaned and dried and Steri-Strips and a bulky sterile dressing were applied.  She was awakened and transported to recovery in stable condition.  Note that a surgical assistant was of medical necessity for this procedure.  Assistance was necessary for retraction of vital neurovascular structures, also for proper positioning of the limb, for safe removal of the old implant, safe and accurate  placement of the new implant.  VN/NUANCE  D:02/22/2020 T:02/22/2020 JOB:[id number]

## 2020-02-22 NOTE — Progress Notes (Signed)
Orthopedic Tech Progress Note Patient Details:  ELEINA JERGENS 1951-04-25 [id number] Patient already has knee immobilizer. Patient ID: JAKEISHA STRICKER, female   DOB: 12/13/1950, 69 y.o.   MRN: [id number]   Braulio Bosch 02/22/2020, 3:52 PM

## 2020-02-22 NOTE — Transfer of Care (Signed)
Immediate Anesthesia Transfer of Care Note  Patient: Christina Clay  Procedure(s) Performed: ACETABULAR  REVISION (Right Hip)  Patient Location: PACU  Anesthesia Type:Spinal  Level of Consciousness: awake, alert , oriented and patient cooperative  Airway & Oxygen Therapy: Patient Spontanous Breathing and Patient connected to face mask oxygen  Post-op Assessment: Report given to RN, Post -op Vital signs reviewed and stable and Patient moving all extremities X 4  Post vital signs: stable  Last Vitals:  Vitals Value Taken Time  BP    Temp    Pulse    Resp    SpO2      Last Pain:  Vitals:   02/22/20 0931  TempSrc:   PainSc: 0-No pain         Complications: No complications documented.

## 2020-02-22 NOTE — Progress Notes (Signed)
Orthopedic Tech Progress Note Patient Details:  Christina Clay 03-19-1951 [id number] Patient does not need overhead frame. Patient ID: Christina Clay, female   DOB: 1951/03/15, 69 y.o.   MRN: [id number]   Braulio Bosch 02/22/2020, 3:55 PM

## 2020-02-22 NOTE — Discharge Instructions (Addendum)
Information on my medicine - XARELTO (Rivaroxaban)  This medication education was reviewed with me or my healthcare representative as part of my discharge preparation.  The pharmacist that spoke with me during my hospital stay was:    Why was Xarelto prescribed for you? Xarelto was prescribed for you to reduce the risk of blood clots forming after orthopedic surgery. The medical term for these abnormal blood clots is venous thromboembolism (VTE).  What do you need to know about xarelto ? Take your Xarelto ONCE DAILY at the same time every day. You may take it either with or without food.  If you have difficulty swallowing the tablet whole, you may crush it and mix in applesauce just prior to taking your dose.  Take Xarelto exactly as prescribed by your doctor and DO NOT stop taking Xarelto without talking to the doctor who prescribed the medication.  Stopping without other VTE prevention medication to take the place of Xarelto may increase your risk of developing a clot.  After discharge, you should have regular check-up appointments with your healthcare provider that is prescribing your Xarelto.    What do you do if you miss a dose? If you miss a dose, take it as soon as you remember on the same day then continue your regularly scheduled once daily regimen the next day. Do not take two doses of Xarelto on the same day.   Important Safety Information A possible side effect of Xarelto is bleeding. You should call your healthcare provider right away if you experience any of the following: ? Bleeding from an injury or your nose that does not stop. ? Unusual colored urine (red or dark brown) or unusual colored stools (red or black). ? Unusual bruising for unknown reasons. ? A serious fall or if you hit your head (even if there is no bleeding).  Some medicines may interact with Xarelto and might increase your risk of bleeding while on Xarelto. To help avoid this, consult your  healthcare provider or pharmacist prior to using any new prescription or non-prescription medications, including herbals, vitamins, non-steroidal anti-inflammatory drugs (NSAIDs) and supplements.  This website has more information on Xarelto: [email or web address]    Dr. Gaynelle Arabian Total Joint Specialist [facility] [address], [place], [place] [address] [phone]  POSTERIOR TOTAL HIP REPLACEMENT POSTOPERATIVE DIRECTIONS  Hip Rehabilitation, Guidelines Following Surgery  The results of a hip operation are greatly improved after range of motion and muscle strengthening exercises. Follow all safety measures which are given to protect your hip. If any of these exercises cause increased pain or swelling in your joint, decrease the amount until you are comfortable again. Then slowly increase the exercises. Call your caregiver if you have problems or questions.   BLOOD CLOT PREVENTION . Take a 10 mg Xarelto once a day for three weeks following surgery. Then resume one 81 mg aspirin once a day. . You may resume your vitamins/supplements once you have discontinued the Xarelto. . Do not take any NSAIDs (Advil, Aleve, Ibuprofen, Meloxicam, etc.) until you have discontinued the Xarelto.   PRECAUTIONS (6 WEEKS FOLLOWING SURGERY) . Do not bend your hip past a 90 degree angle . Do not cross your legs. . Don't twist your hip inwards- keep knees and toes pointed upwards   HOME CARE INSTRUCTIONS  . Remove items at home which could result in a fall. This includes throw rugs or furniture in walking pathways.   ICE to the affected hip every three hours for  30 minutes at a time and then as needed for pain and swelling.  Continue to use ice on the hip for pain and swelling from surgery. You may notice swelling that will progress down to the foot and ankle.  This is normal after surgery.  Elevate the leg when you are not up walking on it.    Continue to use the breathing machine which  will help keep your temperature down.  It is common for your temperature to cycle up and down following surgery, especially at night when you are not up moving around and exerting yourself.  The breathing machine keeps your lungs expanded and your temperature down.  DIET You may resume your previous home diet once your are discharged from the hospital.  DRESSING / WOUND CARE / SHOWERING You have a waterproof adhesive bandage across the incision. Leave this in place until your first follow-up appointment. You may shower 3 days after surgery, but do not submerge the incision under water.  ACTIVITY Walk with your walker as instructed. Use walker as long as suggested by your caregivers. Avoid periods of inactivity such as sitting longer than an hour when not asleep. This helps prevent blood clots.  You may resume a sexual relationship in one month or when given the OK by your doctor.  You may return to work once you are cleared by your doctor.  Do not drive a car for 6 weeks or until released by you surgeon.  Do not drive while taking narcotics.  WEIGHT BEARING Weight bearing as tolerated with assist device (walker, cane, etc) as directed, use it as long as suggested by your surgeon or therapist, typically at least 4-6 weeks.  POSTOPERATIVE CONSTIPATION PROTOCOL Constipation - defined medically as fewer than three stools per week and severe constipation as less than one stool per week.  One of the most common issues patients have following surgery is constipation.  Even if you have a regular bowel pattern at home, your normal regimen is likely to be disrupted due to multiple reasons following surgery.  Combination of anesthesia, postoperative narcotics, change in appetite and fluid intake all can affect your bowels.  In order to avoid complications following surgery, here are some recommendations in order to help you during your recovery period.  Colace (docusate) - Pick up an over-the-counter  form of Colace or another stool softener and take twice a day as long as you are requiring postoperative pain medications.  Take with a full glass of water daily.  If you experience loose stools or diarrhea, hold the colace until you stool forms back up.  If your symptoms do not get better within 1 week or if they get worse, check with your doctor.  Dulcolax (bisacodyl) - Pick up over-the-counter and take as directed by the product packaging as needed to assist with the movement of your bowels.  Take with a full glass of water.  Use this product as needed if not relieved by Colace only.   MiraLax (polyethylene glycol) - Pick up over-the-counter to have on hand.  MiraLax is a solution that will increase the amount of water in your bowels to assist with bowel movements.  Take as directed and can mix with a glass of water, juice, soda, coffee, or tea.  Take if you go more than two days without a movement. Do not use MiraLax more than once per day. Call your doctor if you are still constipated or irregular after using this medication for 7 days  in a row.  If you continue to have problems with postoperative constipation, please contact the office for further assistance and recommendations.  If you experience "the worst abdominal pain ever" or develop nausea or vomiting, please contact the office immediatly for further recommendations for treatment.  ITCHING  If you experience itching with your medications, try taking only a single pain pill, or even half a pain pill at a time.  You can also use Benadryl over the counter for itching or also to help with sleep.   TED HOSE STOCKINGS Wear the elastic stockings on both legs for three weeks following surgery during the day but you may remove then at night for sleeping.  MEDICATIONS See your medication summary on the "After Visit Summary" that the nursing staff will review with you prior to discharge.  You may have some home medications which will be placed on  hold until you complete the course of blood thinner medication.  It is important for you to complete the blood thinner medication as prescribed by your surgeon.  Continue your approved medications as instructed at time of discharge.  PRECAUTIONS If you experience chest pain or shortness of breath - call 911 immediately for transfer to the hospital emergency department.  If you develop a fever greater that 101 F, purulent drainage from wound, increased redness or drainage from wound, foul odor from the wound/dressing, or calf pain - CONTACT YOUR SURGEON.                                                   FOLLOW-UP APPOINTMENTS Make sure you keep all of your appointments after your operation with your surgeon and caregivers. You should call the office at the above phone number and make an appointment for approximately two weeks after the date of your surgery or on the date instructed by your surgeon outlined in the "After Visit Summary".  RANGE OF MOTION AND STRENGTHENING EXERCISES  These exercises are designed to help you keep full movement of your hip joint. Follow your caregiver's or physical therapist's instructions. Perform all exercises about fifteen times, three times per day or as directed. Exercise both hips, even if you have had only one joint replacement. These exercises can be done on a training (exercise) mat, on the floor, on a table or on a bed. Use whatever works the best and is most comfortable for you. Use music or television while you are exercising so that the exercises are a pleasant break in your day. This will make your life better with the exercises acting as a break in routine you can look forward to.  . Lying on your back, slowly slide your foot toward your buttocks, raising your knee up off the floor. Then slowly slide your foot back down until your leg is straight again.  . Lying on your back spread your legs as far apart as you can without causing discomfort.  . Lying on your  side, raise your upper leg and foot straight up from the floor as far as is comfortable. Slowly lower the leg and repeat.  . Lying on your back, tighten up the muscle in the front of your thigh (quadriceps muscles). You can do this by keeping your leg straight and trying to raise your heel off the floor. This helps strengthen the largest muscle supporting your knee.  Marland Kitchen  Lying on your back, tighten up the muscles of your buttocks both with the legs straight and with the knee bent at a comfortable angle while keeping your heel on the floor.   IF YOU ARE TRANSFERRED TO A SKILLED REHAB FACILITY If the patient is transferred to a skilled rehab facility following release from the hospital, a list of the current medications will be sent to the facility for the patient to continue.  When discharged from the skilled rehab facility, please have the facility set up the patient's [place] prior to being released. Also, the skilled facility will be responsible for providing the patient with their medications at time of release from the facility to include their pain medication, the muscle relaxants, and their blood thinner medication. If the patient is still at the rehab facility at time of the two week follow up appointment, the skilled rehab facility will also need to assist the patient in arranging follow up appointment in our office and any transportation needs.  MAKE SURE YOU:  . Understand these instructions.  . Get help right away if you are not doing well or get worse.    Pick up stool softner and laxative for home use following surgery while on pain medications. Do not submerge incision under water. Please use good hand washing techniques while changing dressing each day. May shower starting three days after surgery. Please use a clean towel to pat the incision dry following showers. Continue to use ice for pain and swelling after surgery. Do not use any lotions or creams on the  incision until instructed by your surgeon.

## 2020-02-22 NOTE — Evaluation (Signed)
Physical Therapy Evaluation Patient Details Name: Christina Clay MRN: [id number] DOB: 25-May-1951 Today's Date: 02/22/2020   History of Present Illness  Patient is 69 y.o. female s/p Rt THR (posterior approach) on 02/22/20 with PMH significant for HTN, OA, hx of PE, prior Rt THA in 2014.    Clinical Impression  [name] is a 69 y.o. female POD 0 s/p Rt THR with posterior precautions. Patient reports independence with mobility at baseline. Patient is now limited by functional impairments (see PT problem list below) and requires min assist  for transfers and gait with RW. Patient was able to take several small steps with RW and min assist to complete stand step transfer to recliner. Patient instructed in exercise to facilitate circulation and time spent educating pt/spouse on posterior hip precautions. Patient will benefit from continued skilled PT interventions to address impairments and progress towards PLOF. Acute PT will follow to progress mobility and stair training in preparation for safe discharge home.     Follow Up Recommendations Follow surgeon's recommendation for DC plan and follow-up therapies    Equipment Recommendations  None recommended by PT    Recommendations for Other Services       Precautions / Restrictions Precautions Precautions: Fall;Posterior Hip Precaution Booklet Issued: Yes (comment) Restrictions Weight Bearing Restrictions: No Other Position/Activity Restrictions: WBAT      Mobility  Bed Mobility Overal bed mobility: Needs Assistance Bed Mobility: Supine to Sit     Supine to sit: Min assist;HOB elevated     General bed mobility comments: Assist for Rt LE mobility and to maintain posterior hip precautions. assist required wtih bed pad to scoot fully to EOB, pt using bil UE's to scoot.   Transfers Overall transfer level: Needs assistance Equipment used: Rolling walker (2 wheeled) Transfers: Sit to/from [facility] Sit to  Stand: Min assist;From elevated surface Stand pivot transfers: Min assist       General transfer comment: VC's for technique with RW, assist for power up and to steady with rising. no overt LOB noted. Cues for step sequencing for stand step transfer.  Ambulation/Gait Ambulation/Gait assistance: Min assist Gait Distance (Feet): 5 Feet Assistive device: Rolling walker (2 wheeled) Gait Pattern/deviations: Step-to pattern;Decreased step length - left;Decreased step length - right;Decreased stride length;Decreased weight shift to right Gait velocity: decr   General Gait Details: VC's for safe step sequencing and assist for management of RW to take small steps to recliner.   Stairs            Wheelchair Mobility    Modified Rankin (Stroke Patients Only)       Balance Overall balance assessment: Needs assistance Sitting-balance support: Feet supported Sitting balance-Leahy Scale: Fair     Standing balance support: During functional activity;Bilateral upper extremity supported Standing balance-Leahy Scale: Poor                  Pertinent Vitals/Pain Pain Assessment: 0-10 Pain Score: 8  Pain Location: Rt hip Pain Descriptors / Indicators: Aching;Discomfort Pain Intervention(s): Limited activity within patient's tolerance;Monitored during session;Premedicated before session;Repositioned    Home Living Family/patient expects to be discharged to:: Private residence Living Arrangements: Spouse/significant other Available Help at Discharge: Family Type of Home: House Home Access: Stairs to enter Entrance Stairs-Rails: Chemical engineer of Steps: 5 Home Layout: Two level;Bed/bath upstairs;Full bath on main level;1/2 bath on main level Home Equipment: Walker - 2 wheels;Shower seat;Cane - single point;Grab bars - tub/shower;Bedside commode Additional Comments: pt plans to go up  to second floor bedroom. has assist from husband and has 2 dogs at home.     Prior Function Level of Independence: Independent            Hand Dominance   Dominant Hand: Right    Extremity/Trunk Assessment   Upper Extremity Assessment Upper Extremity Assessment: Overall WFL for tasks assessed    Lower Extremity Assessment Lower Extremity Assessment: RLE deficits/detail RLE: Unable to fully assess due to immobilization RLE Sensation: WNL RLE Coordination: WNL    Cervical / Trunk Assessment Cervical / Trunk Assessment: Normal  Communication   Communication: No difficulties  Cognition Arousal/Alertness: Awake/alert Behavior During Therapy: WFL for tasks assessed/performed Overall Cognitive Status: Within Functional Limits for tasks assessed             General Comments      Exercises Total Joint Exercises Ankle Circles/Pumps: AROM;Both;20 reps;Seated   Assessment/Plan    PT Assessment Patient needs continued PT services  PT Problem List Decreased strength;Decreased range of motion;Decreased activity tolerance;Decreased balance;Decreased mobility;Decreased knowledge of use of DME;Decreased safety awareness;Decreased knowledge of precautions;Pain       PT Treatment Interventions Gait training;DME instruction;Stair training;Functional mobility training;Therapeutic exercise;Therapeutic activities;Balance training;Patient/family education    PT Goals (Current goals can be found in the Care Plan section)  Acute Rehab PT Goals Patient Stated Goal: pt be able to walk around the block for exercise and pleasure again PT Goal Formulation: With patient Time For Goal Achievement: 02/29/20 Potential to Achieve Goals: Good    Frequency 7X/week   Barriers to discharge           AM-PAC PT "6 Clicks" Mobility  Outcome Measure Help needed turning from your back to your side while in a flat bed without using bedrails?: A Little Help needed moving from lying on your back to sitting on the side of a flat bed without using bedrails?: A Lot Help  needed moving to and from a bed to a chair (including a wheelchair)?: A Little Help needed standing up from a chair using your arms (e.g., wheelchair or bedside chair)?: A Little Help needed to walk in hospital room?: A Little Help needed climbing 3-5 steps with a railing? : A Lot 6 Click Score: 16    End of Session Equipment Utilized During Treatment: Gait belt;Right knee immobilizer Activity Tolerance: Patient tolerated treatment well Patient left: in chair;with call bell/phone within reach;with chair alarm set;with family/visitor present Nurse Communication: Mobility status PT Visit Diagnosis: Muscle weakness (generalized) (M62.81);Difficulty in walking, not elsewhere classified (R26.2);Pain Pain - Right/Left: Right Pain - part of body: Hip    Time: 2993-7169 PT Time Calculation (min) (ACUTE ONLY): 28 min   Charges:   PT Evaluation $PT Eval Low Complexity: 1 Low PT Treatments $Therapeutic Activity: 8-22 mins        Verner Mould, DPT Acute Rehabilitation Services  Office [phone] Pager [phone]  02/22/2020 6:31 PM

## 2020-02-22 NOTE — Anesthesia Postprocedure Evaluation (Signed)
Anesthesia Post Note  Patient: Christina Clay  Procedure(s) Performed: ACETABULAR  REVISION (Right Hip)     Patient location during evaluation: PACU Anesthesia Type: Spinal Level of consciousness: oriented and awake and alert Pain management: pain level controlled Vital Signs Assessment: post-procedure vital signs reviewed and stable Respiratory status: spontaneous breathing, respiratory function stable and patient connected to nasal cannula oxygen Cardiovascular status: blood pressure returned to baseline and stable Postop Assessment: no headache, no backache and no apparent nausea or vomiting Anesthetic complications: no   No complications documented.  Last Vitals:  Vitals:   02/22/20 1400 02/22/20 1415  BP: (!) 98/46 (!) 100/49  Pulse: 60 (!) 59  Resp: 14 12  Temp:    SpO2: 99% 100%    Last Pain:  Vitals:   02/22/20 1327  TempSrc:   PainSc: 0-No pain                 Alvah Gilder S

## 2020-02-22 NOTE — Interval H&P Note (Signed)
History and Physical Interval Note:  02/22/2020 10:10 AM  Christina Clay  has presented today for surgery, with the diagnosis of Failed right total hip arthroplasty.  The various methods of treatment have been discussed with the patient and family. After consideration of risks, benefits and other options for treatment, the patient has consented to  Procedure(s) with comments: TOTAL HIP REVISION (Right) - 141min as a surgical intervention.  The patient's history has been reviewed, patient examined, no change in status, stable for surgery.  I have reviewed the patient's chart and labs.  Questions were answered to the patient's satisfaction.     Pilar Plate Yusra Ravert

## 2020-02-23 ENCOUNTER — Encounter (HOSPITAL_COMMUNITY): Payer: Self-pay | Admitting: Orthopedic Surgery

## 2020-02-23 DIAGNOSIS — Z7982 Long term (current) use of aspirin: Secondary | ICD-10-CM | POA: Diagnosis not present

## 2020-02-23 DIAGNOSIS — Z96642 Presence of left artificial hip joint: Secondary | ICD-10-CM | POA: Diagnosis not present

## 2020-02-23 DIAGNOSIS — I10 Essential (primary) hypertension: Secondary | ICD-10-CM | POA: Diagnosis not present

## 2020-02-23 DIAGNOSIS — T84090A Other mechanical complication of internal right hip prosthesis, initial encounter: Secondary | ICD-10-CM | POA: Diagnosis not present

## 2020-02-23 LAB — CBC
HCT: 31.9 % — ABNORMAL LOW (ref 36.0–46.0)
Hemoglobin: 10.2 g/dL — ABNORMAL LOW (ref 12.0–15.0)
MCH: 29.8 pg (ref 26.0–34.0)
MCHC: 32 g/dL (ref 30.0–36.0)
MCV: 93.3 fL (ref 80.0–100.0)
Platelets: 230 10*3/uL (ref 150–400)
RBC: 3.42 MIL/uL — ABNORMAL LOW (ref 3.87–5.11)
RDW: 12.5 % (ref 11.5–15.5)
WBC: 8.9 10*3/uL (ref 4.0–10.5)
nRBC: 0 % (ref 0.0–0.2)

## 2020-02-23 LAB — BASIC METABOLIC PANEL
Anion gap: 8 (ref 5–15)
BUN: 13 mg/dL (ref 8–23)
CO2: 24 mmol/L (ref 22–32)
Calcium: 8.1 mg/dL — ABNORMAL LOW (ref 8.9–10.3)
Chloride: 105 mmol/L (ref 98–111)
Creatinine, Ser: 0.56 mg/dL (ref 0.44–1.00)
GFR calc Af Amer: >60 mL/min (ref >60)
GFR calc non Af Amer: >60 mL/min (ref >60)
Glucose, Bld: 134 mg/dL — ABNORMAL HIGH (ref 70–99)
Potassium: 3.9 mmol/L (ref 3.5–5.1)
Sodium: 137 mmol/L (ref 135–145)

## 2020-02-23 MED ORDER — METHOCARBAMOL 500 MG PO TABS: 500.0000 mg | ORAL_TABLET | Freq: Four times a day (QID) | ORAL | 0 refills | Status: AC | PRN

## 2020-02-23 MED ORDER — TRAMADOL HCL 50 MG PO TABS: 50.0000 mg | ORAL_TABLET | Freq: Four times a day (QID) | ORAL | 0 refills | Status: AC | PRN

## 2020-02-23 MED ORDER — HYDROCODONE-ACETAMINOPHEN 5-325 MG PO TABS: 1.0000 | ORAL_TABLET | Freq: Four times a day (QID) | ORAL | 0 refills | Status: AC | PRN

## 2020-02-23 MED ORDER — RIVAROXABAN 10 MG PO TABS: 10.0000 mg | ORAL_TABLET | Freq: Every day | ORAL | 0 refills | Status: AC

## 2020-02-23 MED ORDER — SODIUM CHLORIDE 0.9 % IV BOLUS: 500.0000 mL | Freq: Once | INTRAVENOUS | Status: DC

## 2020-02-23 NOTE — Plan of Care (Signed)
  Problem: Health Behavior/Discharge Planning: Goal: Ability to manage health-related needs will improve 02/23/2020 1539 by Hubert Azure, RN Outcome: Adequate for Discharge 02/23/2020 1538 by Hubert Azure, RN Outcome: Progressing   Problem: Clinical Measurements: Goal: Ability to maintain clinical measurements within normal limits will improve 02/23/2020 1539 by Hubert Azure, RN Outcome: Adequate for Discharge 02/23/2020 1538 by Hubert Azure, RN Outcome: Progressing Goal: Will remain free from infection 02/23/2020 1539 by Hubert Azure, RN Outcome: Adequate for Discharge 02/23/2020 1538 by Hubert Azure, RN Outcome: Progressing Goal: Diagnostic test results will improve 02/23/2020 1539 by Hubert Azure, RN Outcome: Adequate for Discharge 02/23/2020 1538 by Hubert Azure, RN Outcome: Progressing Goal: Respiratory complications will improve 02/23/2020 1539 by Hubert Azure, RN Outcome: Adequate for Discharge 02/23/2020 1538 by Hubert Azure, RN Outcome: Progressing Goal: Cardiovascular complication will be avoided 02/23/2020 1539 by Hubert Azure, RN Outcome: Adequate for Discharge 02/23/2020 1538 by Hubert Azure, RN Outcome: Progressing   Problem: Activity: Goal: Risk for activity intolerance will decrease Outcome: Adequate for Discharge   Problem: Nutrition: Goal: Adequate nutrition will be maintained Outcome: Adequate for Discharge   Problem: Coping: Goal: Level of anxiety will decrease Outcome: Adequate for Discharge   Problem: Elimination: Goal: Will not experience complications related to bowel motility Outcome: Adequate for Discharge Goal: Will not experience complications related to urinary retention Outcome: Adequate for Discharge   Problem: Pain Managment: Goal: General experience of comfort will improve Outcome: Adequate for Discharge   Problem: Safety: Goal: Ability to remain free from injury will improve Outcome: Adequate for  Discharge   Problem: Skin Integrity: Goal: Risk for impaired skin integrity will decrease Outcome: Adequate for Discharge   Problem: Education: Goal: Knowledge of the prescribed therapeutic regimen will improve Outcome: Adequate for Discharge Goal: Understanding of discharge needs will improve Outcome: Adequate for Discharge   Problem: Activity: Goal: Ability to avoid complications of mobility impairment will improve Outcome: Adequate for Discharge Goal: Ability to tolerate increased activity will improve Outcome: Adequate for Discharge   Problem: Clinical Measurements: Goal: Postoperative complications will be avoided or minimized Outcome: Adequate for Discharge   Problem: Pain Management: Goal: Pain level will decrease with appropriate interventions Outcome: Adequate for Discharge   Problem: Skin Integrity: Goal: Will show signs of wound healing Outcome: Adequate for Discharge

## 2020-02-23 NOTE — Progress Notes (Signed)
Physical Therapy Treatment Patient Details Name: Christina Clay MRN: [id number] DOB: 02/22/1951 Today's Date: 02/23/2020    History of Present Illness Patient is 69 y.o. female s/p Rt THR (posterior approach) on 02/22/20 with PMH significant for HTN, OA, hx of PE, prior Rt THA in 2014.    PT Comments    POD # 1 pm session Assisted with to bathroom then in hallway. General transfer comment: one VC to avloid hip flex > 90 degrees with sit to stand.  Also assisted with a toilet transfer.  25% VC's on B hand placment to push from commode arms. General Gait Details: one VC on safety with turns.  Then returned to room to perform some TE's following HEP handout.  Instructed on proper tech, freq as well as use of ICE.   Addressed all mobility questions, discussed appropriate activity, educated on use of ICE.  Pt ready for D/C to home.   Follow Up Recommendations  Follow surgeons recommendation for DC plan and follow-up therapies     Equipment Recommendations  None recommended by PT    Recommendations for Other Services       Precautions / Restrictions Precautions Precautions: Fall;Posterior Hip Precaution Booklet Issued: Yes (comment) Precaution Comments: reviewed THP Restrictions Weight Bearing Restrictions: No Other Position/Activity Restrictions: WBAT    Mobility  Bed Mobility               General bed mobility comments: OOB in recliner  Transfers Overall transfer level: Needs assistance Equipment used: Rolling walker (2 wheeled) Transfers: Sit to/from Stand Sit to Stand: Supervision         General transfer comment: one VC to avloid hip flex > 90 degrees with sit to stand.  Also assisted with a toilet transfer.  25% VC's on B hand placment to push from commode arms.  Ambulation/Gait Ambulation/Gait assistance: Supervision Gait Distance (Feet): 75 Feet Assistive device: Rolling walker (2 wheeled) Gait Pattern/deviations: Step-to pattern;Decreased step length  - left;Decreased step length - right;Decreased stride length;Decreased weight shift to right Gait velocity: decreased   General Gait Details: one VC on safety with turns   Marine scientist Rankin (Stroke Patients Only)       Balance                                            Cognition Arousal/Alertness: Awake/alert Behavior During Therapy: WFL for tasks assessed/performed Overall Cognitive Status: Within Functional Limits for tasks assessed                                 General Comments: AxO x 3 pleasant      Exercises   Total Hip Replacement TE's following HEP Handout 10 reps ankle pumps 05 reps knee presses 05 reps heel slides 05 reps SAQ's 05 reps ABD Instructed how to use a belt loop to assist  Followed by ICE     General Comments        Pertinent Vitals/Pain Pain Assessment: 0-10 Pain Score: 5  Pain Location: Rt hip Pain Descriptors / Indicators: Aching;Discomfort Pain Intervention(s): Monitored during session;Premedicated before session;Repositioned;Ice applied    Home Living  Prior Function            PT Goals (current goals can now be found in the care plan section) Progress towards PT goals: Progressing toward goals    Frequency    7X/week      PT Plan Current plan remains appropriate    Co-evaluation              AM-PAC PT "6 Clicks" Mobility   Outcome Measure  Help needed turning from your back to your side while in a flat bed without using bedrails?: A Little Help needed moving from lying on your back to sitting on the side of a flat bed without using bedrails?: A Little Help needed moving to and from a bed to a chair (including a wheelchair)?: A Little Help needed standing up from a chair using your arms (e.g., wheelchair or bedside chair)?: A Little Help needed to walk in hospital room?: A Little Help needed climbing  3-5 steps with a railing? : A Little 6 Click Score: 18    End of Session Equipment Utilized During Treatment: Gait belt Activity Tolerance: Patient tolerated treatment well Patient left: in chair;with call bell/phone within reach;with chair alarm set;with family/visitor present Nurse Communication: Mobility status (pt ready for D/C to home) PT Visit Diagnosis: Muscle weakness (generalized) (M62.81);Difficulty in walking, not elsewhere classified (R26.2);Pain Pain - Right/Left: Right Pain - part of body: Hip     Time: 1315-1340 PT Time Calculation (min) (ACUTE ONLY): 25 min  Charges:  $Gait Training: 8-22 mins $Therapeutic Exercise: 8-22 mins                     {Gennell How  PTA Acute  Rehabilitation Services Pager      [phone] Office      [phone]

## 2020-02-23 NOTE — Progress Notes (Signed)
Physical Therapy Treatment Patient Details Name: Christina Clay MRN: [id number] DOB: [birth year] Today's Date: 02/23/2020    History of Present Illness Patient is 69 y.o. female s/p Rt THR (posterior approach) on 02/22/20 with PMH significant for HTN, OA, hx of PE, prior Rt THA in 2014.    PT Comments    Patient progressing well with acute PT and able to recall 3/3 posterior hip precautions without cuing at start of session. Pt complete bed mobility with min guard, no assist needed and pt taking extra time and caution to maintain posterior precautions. She progressed gait training this date and amb ~100' with RW, no overt LOB and maintained safe proximity throughout with minimal cues. Patient initiated stair training to simulate outside steps into home. Cues provided for side step sequencing and pt complete 2x with no overt LOB and good transition back to RW. Patient will benefit from additional PT session this afternoon to review HEP and stair mobility for flight of steps in home. Patients husband will be in this afternoon to participate in therapy session for caregiver training.     Follow Up Recommendations  Follow surgeon's recommendation for DC plan and follow-up therapies     Equipment Recommendations  None recommended by PT    Recommendations for Other Services       Precautions / Restrictions Precautions Precautions: Fall;Posterior Hip Precaution Booklet Issued: Yes (comment) Restrictions Weight Bearing Restrictions: No Other Position/Activity Restrictions: WBAT    Mobility  Bed Mobility Overal bed mobility: Needs Assistance Bed Mobility: Supine to Sit     Supine to sit: HOB elevated;Min guard (pt's bed has adjustable base)     General bed mobility comments: VC's to maintain posterior hip precautions. pt requires extra time, but no assist needed.  Transfers Overall transfer level: Needs assistance Equipment used: Rolling walker (2 wheeled) Transfers: Sit to/from  Stand Sit to Stand: Min assist;Min guard         General transfer comment: VC's required for technique on sit<>stand from EOB, pt did not require cues for safe reach back but does require cues to kick Rt LE forward. Min assist from EOB and min guard from commode.   Ambulation/Gait Ambulation/Gait assistance: Min assist Gait Distance (Feet): 100 Feet Assistive device: Rolling walker (2 wheeled) Gait Pattern/deviations: Step-to pattern;Decreased step length - left;Decreased step length - right;Decreased stride length;Decreased weight shift to right Gait velocity: decr   General Gait Details: VC's for safe step sequencing and assist for management of RW to take small steps to recliner.    Stairs Stairs: Yes Stairs assistance: Min guard Stair Management: One rail Right;Sideways;Step to pattern Number of Stairs: 6 (2x3) General stair comments: VC's for safe side step sequencinging. no overt LOB during stairs an dpt demosntrated safe transition from stair railing to RW. educated on safe guarding and assist for family.    Wheelchair Mobility    Modified Rankin (Stroke Patients Only)       Balance Overall balance assessment: Needs assistance Sitting-balance support: Feet supported Sitting balance-Leahy Scale: Fair     Standing balance support: During functional activity;Bilateral upper extremity supported Standing balance-Leahy Scale: Poor             Cognition Arousal/Alertness: Awake/alert Behavior During Therapy: WFL for tasks assessed/performed Overall Cognitive Status: Within Functional Limits for tasks assessed             Exercises Total Joint Exercises Ankle Circles/Pumps: AROM;Both;20 reps;Seated Quad Sets: AROM;Right;10 reps;Seated Heel Slides: AROM;AAROM;Right;10 reps;Seated (cues to limit hip  flexion <90)    General Comments        Pertinent Vitals/Pain Pain Assessment: 0-10 Pain Score: 4  Pain Location: Rt hip Pain Descriptors / Indicators:  Aching;Discomfort Pain Intervention(s): Limited activity within patient's tolerance;Premedicated before session;Monitored during session;Repositioned;Ice applied           PT Goals (current goals can now be found in the care plan section) Acute Rehab PT Goals Patient Stated Goal: pt be able to walk around the block for exercise and pleasure again PT Goal Formulation: With patient Time For Goal Achievement: 02/29/20 Potential to Achieve Goals: Good Progress towards PT goals: Progressing toward goals    Frequency    7X/week      PT Plan Current plan remains appropriate       AM-PAC PT "6 Clicks" Mobility   Outcome Measure  Help needed turning from your back to your side while in a flat bed without using bedrails?: A Little Help needed moving from lying on your back to sitting on the side of a flat bed without using bedrails?: A Little Help needed moving to and from a bed to a chair (including a wheelchair)?: A Little Help needed standing up from a chair using your arms (e.g., wheelchair or bedside chair)?: A Little Help needed to walk in hospital room?: A Little Help needed climbing 3-5 steps with a railing? : A Little 6 Click Score: 18    End of Session Equipment Utilized During Treatment: Gait belt;Right knee immobilizer Activity Tolerance: Patient tolerated treatment well Patient left: in chair;with call bell/phone within reach;with chair alarm set;with family/visitor present Nurse Communication: Mobility status PT Visit Diagnosis: Muscle weakness (generalized) (M62.81);Difficulty in walking, not elsewhere classified (R26.2);Pain Pain - Right/Left: Right Pain - part of body: Hip     Time: 6222-9798 PT Time Calculation (min) (ACUTE ONLY): 34 min  Charges:  $Gait Training: 8-22 mins $Therapeutic Exercise: 8-22 mins                     Verner Mould, DPT Acute Rehabilitation Services  Office [phone] Pager [phone]  02/23/2020 10:44 AM

## 2020-02-23 NOTE — TOC Transition Note (Signed)
Transition of Care [facility]) - CM/SW Discharge Note   Patient Details  Name: Christina Clay MRN: [id number] Date of Birth: 1951-01-01  Transition of Care [facility]) CM/SW Contact:  Lennart Pall, LCSW Phone Number: 02/23/2020, 9:22 AM   Clinical Narrative:    Met briefly with pt and confirming she already has needed DME.  Plan for HEP.  No further TOC needs.   Final next level of care: Home/Self Care Barriers to Discharge: No Barriers Identified   Patient Goals and CMS Choice Patient states their goals for this hospitalization and ongoing recovery are:: go home      Discharge Placement                       Discharge Plan and Services                DME Arranged: N/A DME Agency: NA       HH Arranged: NA HH Agency: NA        Social Determinants of Health (SDOH) Interventions     Readmission Risk Interventions Readmission Risk Prevention Plan 02/23/2020  Post Dischage Appt Complete  Medication Screening Complete  Transportation Screening Complete  Some recent data might be hidden

## 2020-02-23 NOTE — Progress Notes (Addendum)
° °  Subjective: 1 Day Post-Op Procedure(s) (LRB): ACETABULAR  REVISION (Right) Patient reports pain as mild.   Patient seen in rounds by Dr. Wynelle Link. Patient is well, and has had no acute complaints or problems other than pain in the right hip. Denies chest pain or SOB. Foley catheter to be removed this AM. We will continue therapy today.   Objective: Vital signs in last 24 hours: Temp:  [97.5 F (36.4 C)-98.9 F (37.2 C)] 97.9 F (36.6 C) (08/12 0614) Pulse Rate:  [59-81] 61 (08/12 0614) Resp:  [9-18] 17 (08/12 0614) BP: (98-156)/(46-62) 102/54 (08/12 0614) SpO2:  [95 %-100 %] 95 % (08/12 0614) Weight:  [83.5 kg] 83.5 kg (08/11 0915)  Intake/Output from previous day:  Intake/Output Summary (Last 24 hours) at 02/23/2020 0702 Last data filed at 02/23/2020 0600 Gross per 24 hour  Intake 5004.78 ml  Output 3000 ml  Net 2004.78 ml     Intake/Output this shift: No intake/output data recorded.  Labs: Recent Labs    02/23/20 0308  HGB 10.2*   Recent Labs    02/23/20 0308  WBC 8.9  RBC 3.42*  HCT 31.9*  PLT 230   Recent Labs    02/23/20 0308  NA 137  K 3.9  CL 105  CO2 24  BUN 13  CREATININE 0.56  GLUCOSE 134*  CALCIUM 8.1*   No results for input(s): LABPT, INR in the last 72 hours.  Exam: General - Patient is Alert and Oriented Extremity - Neurologically intact Neurovascular intact Sensation intact distally Dorsiflexion/Plantar flexion intact Dressing - dressing C/D/I Motor Function - intact, moving foot and toes well on exam.   Past Medical History:  Diagnosis Date   Arthritis    OA   DJD (degenerative joint disease)    LOWER BACK   History of kidney stones 2019   HTN (hypertension)    Hypercholesterolemia    Pulmonary embolism ([place]) 03/2013    2 DAYS AFTER AFTER RIGHT HIP REPLACEMENT SURGERY TOOK XARELTO FOR A FEW MONTHS SURGERY WAS CAUSE    Assessment/Plan: 1 Day Post-Op Procedure(s) (LRB): ACETABULAR  REVISION (Right) Active  Problems:   S/P right total hip revision, posterior  Estimated body mass index is 31.58 kg/m as calculated from the following:   Height as of this encounter: 5\' 4"  (1.626 m).   Weight as of this encounter: 83.5 kg. Advance diet Up with therapy  DVT Prophylaxis - Xarelto Weight bearing as tolerated. Continue therapy. Hemovac drain pulled without difficulty. Posterior hip precautions discussed with the patient.  BP soft this AM, 500 mL bolus ordered.  Plan is to go Home after hospital stay. Plan for discharge later today with HEP if meeting goals with physical therapy. Follow-up in the office August 24th.  The PDMP database was reviewed today (02/23/2020) prior to any opioid medications being prescribed to this patient.   Theresa Duty, PA-C Orthopedic Surgery [phone] 02/23/2020, 7:02 AM

## 2020-02-23 NOTE — Care Management CC44 (Signed)
Condition Code 44 Documentation Completed  Patient Details  Name: ADDIS BENNIE MRN: [id number] Date of Birth: [birth year]   Condition Code 44 given:  Yes Patient signature on Condition Code 44 notice:  Yes Documentation of 2 MD's agreement:  Yes Code 44 added to claim:  Yes    Jazen Spraggins, LCSW 02/23/2020, 9:18 AM

## 2020-02-23 NOTE — Care Management Obs Status (Signed)
[facility] NOTIFICATION   Patient Details  Name: Christina Clay MRN: [id number] Date of Birth: 1950/09/28   Medicare Observation Status Notification Given:  Macario Golds, [place] 02/23/2020, 9:17 AM

## 2020-02-23 NOTE — Plan of Care (Signed)

## 2020-02-23 NOTE — Plan of Care (Signed)
Problem: Health Behavior/Discharge Planning: Goal: Ability to manage health-related needs will improve 02/23/2020 1539 by Hubert Azure, RN Outcome: Adequate for Discharge 02/23/2020 1539 by Hubert Azure, RN Outcome: Adequate for Discharge 02/23/2020 1538 by Hubert Azure, RN Outcome: Progressing   Problem: Clinical Measurements: Goal: Ability to maintain clinical measurements within normal limits will improve 02/23/2020 1539 by Hubert Azure, RN Outcome: Adequate for Discharge 02/23/2020 1539 by Hubert Azure, RN Outcome: Adequate for Discharge 02/23/2020 1538 by Hubert Azure, RN Outcome: Progressing Goal: Will remain free from infection 02/23/2020 1539 by Hubert Azure, RN Outcome: Adequate for Discharge 02/23/2020 1539 by Hubert Azure, RN Outcome: Adequate for Discharge 02/23/2020 1538 by Hubert Azure, RN Outcome: Progressing Goal: Diagnostic test results will improve 02/23/2020 1539 by Hubert Azure, RN Outcome: Adequate for Discharge 02/23/2020 1539 by Hubert Azure, RN Outcome: Adequate for Discharge 02/23/2020 1538 by Hubert Azure, RN Outcome: Progressing Goal: Respiratory complications will improve 02/23/2020 1539 by Hubert Azure, RN Outcome: Adequate for Discharge 02/23/2020 1539 by Hubert Azure, RN Outcome: Adequate for Discharge 02/23/2020 1538 by Hubert Azure, RN Outcome: Progressing Goal: Cardiovascular complication will be avoided 02/23/2020 1539 by Hubert Azure, RN Outcome: Adequate for Discharge 02/23/2020 1539 by Hubert Azure, RN Outcome: Adequate for Discharge 02/23/2020 1538 by Hubert Azure, RN Outcome: Progressing   Problem: Activity: Goal: Risk for activity intolerance will decrease 02/23/2020 1539 by Hubert Azure, RN Outcome: Adequate for Discharge 02/23/2020 1539 by Hubert Azure, RN Outcome: Adequate for Discharge   Problem: Nutrition: Goal: Adequate nutrition will be maintained 02/23/2020 1539 by Hubert Azure, RN Outcome: Adequate for Discharge 02/23/2020 1539 by Hubert Azure, RN Outcome: Adequate for Discharge   Problem: Coping: Goal: Level of anxiety will decrease 02/23/2020 1539 by Hubert Azure, RN Outcome: Adequate for Discharge 02/23/2020 1539 by Hubert Azure, RN Outcome: Adequate for Discharge   Problem: Elimination: Goal: Will not experience complications related to bowel motility 02/23/2020 1539 by Hubert Azure, RN Outcome: Adequate for Discharge 02/23/2020 1539 by Hubert Azure, RN Outcome: Adequate for Discharge Goal: Will not experience complications related to urinary retention 02/23/2020 1539 by Hubert Azure, RN Outcome: Adequate for Discharge 02/23/2020 1539 by Hubert Azure, RN Outcome: Adequate for Discharge   Problem: Pain Managment: Goal: General experience of comfort will improve 02/23/2020 1539 by Hubert Azure, RN Outcome: Adequate for Discharge 02/23/2020 1539 by Hubert Azure, RN Outcome: Adequate for Discharge   Problem: Safety: Goal: Ability to remain free from injury will improve 02/23/2020 1539 by Hubert Azure, RN Outcome: Adequate for Discharge 02/23/2020 1539 by Hubert Azure, RN Outcome: Adequate for Discharge   Problem: Skin Integrity: Goal: Risk for impaired skin integrity will decrease 02/23/2020 1539 by Hubert Azure, RN Outcome: Adequate for Discharge 02/23/2020 1539 by Hubert Azure, RN Outcome: Adequate for Discharge   Problem: Education: Goal: Knowledge of the prescribed therapeutic regimen will improve 02/23/2020 1539 by Hubert Azure, RN Outcome: Adequate for Discharge 02/23/2020 1539 by Hubert Azure, RN Outcome: Adequate for Discharge Goal: Understanding of discharge needs will improve 02/23/2020 1539 by Hubert Azure, RN Outcome: Adequate for Discharge 02/23/2020 1539 by Hubert Azure, RN Outcome: Adequate for Discharge   Problem: Activity: Goal: Ability to avoid complications of mobility  impairment will improve 02/23/2020 1539 by Hubert Azure, RN Outcome: Adequate for Discharge 02/23/2020 1539 by Gerilyn Nestle  E, RN Outcome: Adequate for Discharge Goal: Ability to tolerate increased activity will improve 02/23/2020 1539 by Hubert Azure, RN Outcome: Adequate for Discharge 02/23/2020 1539 by Hubert Azure, RN Outcome: Adequate for Discharge   Problem: Clinical Measurements: Goal: Postoperative complications will be avoided or minimized 02/23/2020 1539 by Hubert Azure, RN Outcome: Adequate for Discharge 02/23/2020 1539 by Hubert Azure, RN Outcome: Adequate for Discharge   Problem: Pain Management: Goal: Pain level will decrease with appropriate interventions 02/23/2020 1539 by Hubert Azure, RN Outcome: Adequate for Discharge 02/23/2020 1539 by Hubert Azure, RN Outcome: Adequate for Discharge   Problem: Skin Integrity: Goal: Will show signs of wound healing 02/23/2020 1539 by Hubert Azure, RN Outcome: Adequate for Discharge 02/23/2020 1539 by Hubert Azure, RN Outcome: Adequate for Discharge

## 2020-02-27 NOTE — Discharge Summary (Signed)
Physician Discharge Summary   Patient ID: Christina Clay MRN: [id number] DOB/AGE: 1951-03-06 69 y.o.  Admit date: 02/22/2020 Discharge date: 02/23/2020  Primary Diagnosis: Failed right total hip arthroplasty    Admission Diagnoses:  Past Medical History:  Diagnosis Date  . Arthritis    OA  . DJD (degenerative joint disease)    LOWER BACK  . History of kidney stones 2019  . HTN (hypertension)   . Hypercholesterolemia   . Pulmonary embolism ([place]) 03/2013    2 DAYS AFTER AFTER RIGHT HIP REPLACEMENT SURGERY TOOK [place] FOR A FEW MONTHS SURGERY WAS CAUSE   Discharge Diagnoses:   Active Problems:   S/P right total hip revision, posterior  Estimated body mass index is 31.58 kg/m as calculated from the following:   Height as of this encounter: 5\' 4"  (1.626 m).   Weight as of this encounter: 83.5 kg.  Procedure:  Procedure(s) (LRB): ACETABULAR  REVISION (Right)   Consults: None  HPI: The patient is a 69 year old female with a severely painful right total hip arthroplasty done several years ago.  She has had extensive workup showing no evidence of loosening and no evidence of infection.  She has also had her back evaluated showing no evidence of a lumbar etiology for this.  Her x-rays do show her acetabular component is significantly lateralized with significant overhang of the component over the bony margins.  Thus, I feel the problem is secondary to impingement.  She presents now for acetabular versus total hip revision.  We discussed that we would revise the acetabulum and if we can gain a stable construct with her current femoral stem and if it was stable in the bone, that we would not need to  revise the femur.  If indeed she had a loose component or we could not achieve implants stability, then we would have to revise both components.  Laboratory Data: Admission on 02/22/2020, Discharged on 02/23/2020  Component Date Value Ref Range Status  . WBC 02/23/2020 8.9  4.0 - 10.5  K/uL Final  . RBC 02/23/2020 3.42* 3.87 - 5.11 MIL/uL Final  . Hemoglobin 02/23/2020 10.2* 12.0 - 15.0 g/dL Final  . HCT 02/23/2020 31.9* 36 - 46 % Final  . MCV 02/23/2020 93.3  80.0 - 100.0 fL Final  . MCH 02/23/2020 29.8  26.0 - 34.0 pg Final  . MCHC 02/23/2020 32.0  30.0 - 36.0 g/dL Final  . RDW 02/23/2020 12.5  11.5 - 15.5 % Final  . Platelets 02/23/2020 230  150 - 400 K/uL Final  . nRBC 02/23/2020 0.0  0.0 - 0.2 % Final   Performed at [facility], [place] [address], [place], [place] [address]  . Sodium 02/23/2020 137  135 - 145 mmol/L Final  . Potassium 02/23/2020 3.9  3.5 - 5.1 mmol/L Final  . Chloride 02/23/2020 105  98 - 111 mmol/L Final  . CO2 02/23/2020 24  22 - 32 mmol/L Final  . Glucose, Bld 02/23/2020 134* 70 - 99 mg/dL Final   Glucose reference range applies only to samples taken after fasting for at least 8 hours.  . BUN 02/23/2020 13  8 - 23 mg/dL Final  . Creatinine, Ser 02/23/2020 0.56  0.44 - 1.00 mg/dL Final  . Calcium 02/23/2020 8.1* 8.9 - 10.3 mg/dL Final  . GFR calc non Af Amer 02/23/2020 >60  >60 mL/min Final  . GFR calc Af Amer 02/23/2020 >60  >60 mL/min Final  . Anion gap 02/23/2020 8  5 -  15 Final   Performed at [facility], [place] [address] [place], [place] [address]  Hospital Outpatient Visit on 02/18/2020  Component Date Value Ref Range Status  . SARS Coronavirus 2 02/18/2020 NEGATIVE  NEGATIVE Final   Comment: (NOTE) SARS-CoV-2 target nucleic acids are NOT DETECTED.  The SARS-CoV-2 RNA is generally detectable in upper and lower respiratory specimens during the acute phase of infection. Negative results do not preclude SARS-CoV-2 infection, do not rule out co-infections with other pathogens, and should not be used as the sole basis for treatment or other patient management decisions. Negative results must be combined with clinical observations, patient history, and epidemiological information. The expected result  is Negative.  Fact Sheet for Patients: SugarRoll.be  Fact Sheet for Healthcare Providers: https://www.woods-mathews.com/  This test is not yet approved or cleared by the [place] FDA and  has been authorized for detection and/or diagnosis of SARS-CoV-2 by FDA under an Emergency Use Authorization (EUA). This EUA will remain  in effect (meaning this test can be used) for the duration of the COVID-19 declaration under Se                          ction 564(b)(1) of the Act, 21 U.S.C. section 360bbb-3(b)(1), unless the authorization is terminated or revoked sooner.  Performed at [facility], [place] [address], [place], [place] [address]   Hospital Outpatient Visit on 02/14/2020  Component Date Value Ref Range Status  . aPTT 02/14/2020 25  24 - 36 seconds Final   Performed at [facility], [place] [address] [place], [place] [address]  . WBC 02/14/2020 6.4  4.0 - 10.5 K/uL Final  . RBC 02/14/2020 4.65  3.87 - 5.11 MIL/uL Final  . Hemoglobin 02/14/2020 13.4  12.0 - 15.0 g/dL Final  . HCT 02/14/2020 42.9  36 - 46 % Final  . MCV 02/14/2020 92.3  80.0 - 100.0 fL Final  . MCH 02/14/2020 28.8  26.0 - 34.0 pg Final  . MCHC 02/14/2020 31.2  30.0 - 36.0 g/dL Final  . RDW 02/14/2020 12.7  11.5 - 15.5 % Final  . Platelets 02/14/2020 306  150 - 400 K/uL Final  . nRBC 02/14/2020 0.0  0.0 - 0.2 % Final   Performed at [facility], [place] [address] [place],  [address]  . Sodium 02/14/2020 140  135 - 145 mmol/L Final  . Potassium 02/14/2020 3.5  3.5 - 5.1 mmol/L Final  . Chloride 02/14/2020 104  98 - 111 mmol/L Final  . CO2 02/14/2020 28  22 - 32 mmol/L Final  . Glucose, Bld 02/14/2020 93  70 - 99 mg/dL Final   Glucose reference range applies only to samples taken after fasting for at least 8 hours.  . BUN 02/14/2020 20  8 - 23 mg/dL Final  . Creatinine, Ser 02/14/2020 0.69  0.44 - 1.00 mg/dL Final  .  Calcium 02/14/2020 8.8* 8.9 - 10.3 mg/dL Final  . Total Protein 02/14/2020 6.8  6.5 - 8.1 g/dL Final  . Albumin 02/14/2020 4.5  3.5 - 5.0 g/dL Final  . AST 02/14/2020 21  15 - 41 U/L Final  . ALT 02/14/2020 20  0 - 44 U/L Final  . Alkaline Phosphatase 02/14/2020 52  38 - 126 U/L Final  . Total Bilirubin 02/14/2020 0.5  0.3 - 1.2 mg/dL Final  . GFR calc non Af Amer 02/14/2020 >60  >60 mL/min Final  .  GFR calc Af Amer 02/14/2020 >60  >60 mL/min Final  . Anion gap 02/14/2020 8  5 - 15 Final   Performed at [facility], [place] [address] [place], [place] [address]  . Prothrombin Time 02/14/2020 11.5  11.4 - 15.2 seconds Final  . INR 02/14/2020 0.9  0.8 - 1.2 Final   Comment: (NOTE) INR goal varies based on device and disease states. Performed at [facility], [place] [address], [place], [place] [address]   . ABO/RH(D) 02/14/2020 A POS   Final  . Antibody Screen 02/14/2020 NEG   Final  . Sample Expiration 02/14/2020 02/25/2020,2359   Final  . Extend sample reason 02/14/2020    Final                   Value:NO TRANSFUSIONS OR PREGNANCY IN THE PAST 3 MONTHS Performed at [facility], [place] [address] [place], [place] [address]   . MRSA, PCR 02/14/2020 NEGATIVE  NEGATIVE Final  . Staphylococcus aureus 02/14/2020 POSITIVE* NEGATIVE Final   Comment: (NOTE) The Xpert SA Assay (FDA approved for NASAL specimens in patients 31 years of age and older), is one component of a comprehensive surveillance program. It is not intended to diagnose infection nor to guide or monitor treatment. Performed at [facility], [place] [address], [place], [place] [address]      X-Rays:X-ray pelvis complete  Result Date: 02/22/2020 CLINICAL DATA:  Right hip arthroplasty revision. EXAM: PELVIS - 1-2 VIEW COMPARISON:  CT abdomen pelvis dated May 07, 2018. FINDINGS: The right hip demonstrates a total arthroplasty revision without evidence of  hardware failure or complication. There is no do fracture or dislocation. Old healed fractures of the bilateral pubic rami, new since the prior study. The alignment is anatomic. Post-surgical changes noted in the surrounding soft tissues. Prior left total hip arthroplasty without hardware complication. IMPRESSION: 1. Interval right total hip arthroplasty revision without acute postoperative complication. 2. Left total hip arthroplasty without hardware complication. 3. Old healed bilateral pubic rami fractures, new since the prior study. Electronically Signed   By: Titus Dubin M.D.   On: 02/22/2020 14:37    EKG: Orders placed or performed during the hospital encounter of 02/14/20  . EKG 12-Lead  . EKG 12-Lead  . EKG 12 lead per protocol  . EKG 12 lead per protocol     Hospital Course: KRISA BLATTNER is a 69 y.o. who was admitted to [facility]. They were brought to the operating room on 02/22/2020 and underwent Procedure(s): ACETABULAR  REVISION.  Patient tolerated the procedure well and was later transferred to the recovery room and then to the orthopaedic floor for postoperative care. They were given PO and IV analgesics for pain control following their surgery. They were given 24 hours of postoperative antibiotics of  Anti-infectives (From admission, onward)   Start     Dose/Rate Route Frequency Ordered Stop   02/22/20 2130  ceFAZolin (ANCEF) IVPB 2g/100 mL premix        2 g 200 mL/hr over 30 Minutes Intravenous Every 6 hours 02/22/20 1458 02/23/20 0330   02/22/20 0919  ceFAZolin (ANCEF) 2-4 GM/100ML-% IVPB       Note to Pharmacy: Kyra Leyland   : cabinet override      02/22/20 0919 02/22/20 1119   02/22/20 0915  ceFAZolin (ANCEF) IVPB 2g/100 mL premix        2 g 200 mL/hr over 30 Minutes Intravenous On call  to O.R. 02/22/20 0914 02/22/20 1119     and started on DVT prophylaxis in the form of Xarelto.   PT and OT were ordered for total joint protocol. Discharge planning  consulted to help with postop disposition and equipment needs.  Patient had a good night on the evening of surgery. They started to get up OOB with therapy on POD #0. Pt was seen during rounds and was ready to go home pending progress with therapy. BP was noted to be soft, 500 mL bolus ordered. Hemovac drain was pulled without difficulty. She worked with therapy on POD #1 and was meeting her goals. Pt was discharged to home later that day in stable condition.  Diet: Regular diet Activity: WBAT Posterior hip precautions Follow-up: in 2 weeks Disposition: Home with HEP Discharged Condition: stable   Discharge Instructions    Call MD / Call 911   Complete by: As directed    If you experience chest pain or shortness of breath, CALL 911 and be transported to the hospital emergency room.  If you develope a fever above 101 F, pus (white drainage) or increased drainage or redness at the wound, or calf pain, call your surgeon's office.   Change dressing   Complete by: As directed    You have an adhesive waterproof bandage over the incision. Leave this in place until your first follow-up appointment. Once you remove this you will not need to place another bandage.   Constipation Prevention   Complete by: As directed    Drink plenty of fluids.  Prune juice may be helpful.  You may use a stool softener, such as Colace (over the counter) 100 mg twice a day.  Use MiraLax (over the counter) for constipation as needed.   Diet - low sodium heart healthy   Complete by: As directed    Do not sit on low chairs, stoools or toilet seats, as it may be difficult to get up from low surfaces   Complete by: As directed    Driving restrictions   Complete by: As directed    No driving for two weeks   Follow the hip precautions as taught in Physical Therapy   Complete by: As directed    TED hose   Complete by: As directed    Use stockings (TED hose) for three weeks on both leg(s).  You may remove them at night for  sleeping.   Weight bearing as tolerated   Complete by: As directed      Allergies as of 02/23/2020      Reactions   Naproxen Sodium Hives, Shortness Of Breath, Itching, Rash      Medication List    STOP taking these medications   aspirin EC 81 MG tablet   ibuprofen 800 MG tablet Commonly known as: ADVIL   PRESERVISION AREDS 2 PO     TAKE these medications   acetaminophen 500 MG tablet Commonly known as: TYLENOL Take 500-1,000 mg by mouth every 6 (six) hours as needed (for pain.).   amLODipine 5 MG tablet Commonly known as: NORVASC Take 5 mg by mouth 2 (two) times daily.   amoxicillin 500 MG capsule Commonly known as: AMOXIL Take 2,000 mg by mouth See admin instructions. Take 4 capsules (2000 mg) by mouth 1 hour prior to dental appointments.   Eszopiclone 3 MG Tabs Take 3 mg by mouth at bedtime. Take immediately before bedtime   hydrochlorothiazide 25 MG tablet Commonly known as: HYDRODIURIL Take 12.5 mg by mouth  daily.   HYDROcodone-acetaminophen 5-325 MG tablet Commonly known as: NORCO/VICODIN Take 1-2 tablets by mouth every 6 (six) hours as needed for severe pain.   losartan 100 MG tablet Commonly known as: COZAAR Take 100 mg by mouth daily.   methocarbamol 500 MG tablet Commonly known as: ROBAXIN Take 1 tablet (500 mg total) by mouth every 6 (six) hours as needed for muscle spasms.   pravastatin 40 MG tablet Commonly known as: PRAVACHOL Take 20 mg by mouth daily.   rivaroxaban 10 MG Tabs tablet Commonly known as: XARELTO Take 1 tablet (10 mg total) by mouth daily with breakfast for 20 days. Then resume one 81 mg aspirin once a day.   traMADol 50 MG tablet Commonly known as: ULTRAM Take 1-2 tablets (50-100 mg total) by mouth every 6 (six) hours as needed for moderate pain.            Discharge Care Instructions  (From admission, onward)         Start     Ordered   02/23/20 0000  Weight bearing as tolerated        02/23/20 0708   02/23/20  0000  Change dressing       Comments: You have an adhesive waterproof bandage over the incision. Leave this in place until your first follow-up appointment. Once you remove this you will not need to place another bandage.   02/23/20 0708          Follow-up Information    Gaynelle Arabian, MD. Schedule an appointment as soon as possible for a visit on 03/06/2020.   Specialty: Orthopedic Surgery Contact information: [address] [place] [address] [phone]               Signed: Theresa Duty, PA-C Orthopedic Surgery 02/27/2020, 7:19 AM

## 2020-03-01 DIAGNOSIS — Z713 Dietary counseling and surveillance: Secondary | ICD-10-CM | POA: Diagnosis not present

## 2020-03-01 DIAGNOSIS — Z6830 Body mass index (BMI) 30.0-30.9, adult: Secondary | ICD-10-CM | POA: Diagnosis not present

## 2020-03-01 DIAGNOSIS — I10 Essential (primary) hypertension: Secondary | ICD-10-CM | POA: Diagnosis not present

## 2020-03-01 DIAGNOSIS — Z96641 Presence of right artificial hip joint: Secondary | ICD-10-CM | POA: Diagnosis not present

## 2020-03-01 DIAGNOSIS — Z299 Encounter for prophylactic measures, unspecified: Secondary | ICD-10-CM | POA: Diagnosis not present

## 2020-03-27 DIAGNOSIS — Z96641 Presence of right artificial hip joint: Secondary | ICD-10-CM | POA: Diagnosis not present

## 2020-03-27 DIAGNOSIS — Z471 Aftercare following joint replacement surgery: Secondary | ICD-10-CM | POA: Diagnosis not present

## 2020-05-10 DIAGNOSIS — M549 Dorsalgia, unspecified: Secondary | ICD-10-CM | POA: Diagnosis not present

## 2020-05-10 DIAGNOSIS — G4700 Insomnia, unspecified: Secondary | ICD-10-CM | POA: Diagnosis not present

## 2020-05-10 DIAGNOSIS — I10 Essential (primary) hypertension: Secondary | ICD-10-CM | POA: Diagnosis not present

## 2020-05-10 DIAGNOSIS — Z299 Encounter for prophylactic measures, unspecified: Secondary | ICD-10-CM | POA: Diagnosis not present

## 2020-05-10 DIAGNOSIS — E7800 Pure hypercholesterolemia, unspecified: Secondary | ICD-10-CM | POA: Diagnosis not present

## 2020-05-16 DIAGNOSIS — Z23 Encounter for immunization: Secondary | ICD-10-CM | POA: Diagnosis not present

## 2020-06-10 DIAGNOSIS — J329 Chronic sinusitis, unspecified: Secondary | ICD-10-CM | POA: Diagnosis not present

## 2020-06-10 DIAGNOSIS — R059 Cough, unspecified: Secondary | ICD-10-CM | POA: Diagnosis not present

## 2020-06-10 DIAGNOSIS — R0981 Nasal congestion: Secondary | ICD-10-CM | POA: Diagnosis not present

## 2021-01-23 ENCOUNTER — Other Ambulatory Visit (HOSPITAL_COMMUNITY): Payer: Self-pay | Admitting: Student

## 2021-01-23 DIAGNOSIS — Z96641 Presence of right artificial hip joint: Secondary | ICD-10-CM

## 2021-01-28 ENCOUNTER — Encounter (HOSPITAL_COMMUNITY)
Admission: RE | Admit: 2021-01-28 | Discharge: 2021-01-28 | Disposition: A | Discharge status: Home / Self Care | Payer: Medicare Other | Source: Ambulatory Visit | Attending: Student | Admitting: Student

## 2021-01-28 ENCOUNTER — Other Ambulatory Visit: Payer: Self-pay

## 2021-01-28 DIAGNOSIS — Z96641 Presence of right artificial hip joint: Secondary | ICD-10-CM | POA: Diagnosis not present

## 2021-01-28 MED ORDER — TECHNETIUM TC 99M MEDRONATE IV KIT
20.0000 | PACK | Freq: Once | INTRAVENOUS | Status: AC | PRN
  Administered 2021-01-28: 20.4 via INTRAVENOUS

## 2023-07-09 ENCOUNTER — Encounter (HOSPITAL_COMMUNITY): Payer: Self-pay

## 2023-07-09 ENCOUNTER — Other Ambulatory Visit: Payer: Self-pay

## 2023-07-09 ENCOUNTER — Emergency Department (HOSPITAL_COMMUNITY)
Admission: EM | Admit: 2023-07-09 | Discharge: 2023-07-09 | Disposition: A | Discharge status: Home / Self Care | Payer: Medicare Other | Attending: Emergency Medicine | Admitting: Emergency Medicine

## 2023-07-09 DIAGNOSIS — I471 Supraventricular tachycardia, unspecified: Secondary | ICD-10-CM | POA: Diagnosis not present

## 2023-07-09 DIAGNOSIS — E876 Hypokalemia: Secondary | ICD-10-CM | POA: Diagnosis not present

## 2023-07-09 DIAGNOSIS — I10 Essential (primary) hypertension: Secondary | ICD-10-CM | POA: Insufficient documentation

## 2023-07-09 DIAGNOSIS — Z79899 Other long term (current) drug therapy: Secondary | ICD-10-CM | POA: Insufficient documentation

## 2023-07-09 DIAGNOSIS — R000 Tachycardia, unspecified: Secondary | ICD-10-CM | POA: Diagnosis present

## 2023-07-09 LAB — BASIC METABOLIC PANEL
Anion gap: 15 (ref 5–15)
BUN: 16 mg/dL (ref 8–23)
CO2: 23 mmol/L (ref 22–32)
Calcium: 9.3 mg/dL (ref 8.9–10.3)
Chloride: 99 mmol/L (ref 98–111)
Creatinine, Ser: 0.7 mg/dL (ref 0.44–1.00)
GFR, Estimated: >60 mL/min (ref >60)
Glucose, Bld: 168 mg/dL — ABNORMAL HIGH (ref 70–99)
Potassium: 2.9 mmol/L — ABNORMAL LOW (ref 3.5–5.1)
Sodium: 137 mmol/L (ref 135–145)

## 2023-07-09 LAB — CBC WITH DIFFERENTIAL/PLATELET
Abs Immature Granulocytes: 0.04 10*3/uL (ref 0.00–0.07)
Basophils Absolute: 0 10*3/uL (ref 0.0–0.1)
Basophils Relative: 0 %
Eosinophils Absolute: 0.1 10*3/uL (ref 0.0–0.5)
Eosinophils Relative: 1 %
HCT: 42.4 % (ref 36.0–46.0)
Hemoglobin: 13.9 g/dL (ref 12.0–15.0)
Immature Granulocytes: 0 %
Lymphocytes Relative: 14 %
Lymphs Abs: 1.6 10*3/uL (ref 0.7–4.0)
MCH: 28.8 pg (ref 26.0–34.0)
MCHC: 32.8 g/dL (ref 30.0–36.0)
MCV: 87.8 fL (ref 80.0–100.0)
Monocytes Absolute: 0.5 10*3/uL (ref 0.1–1.0)
Monocytes Relative: 5 %
Neutro Abs: 9 10*3/uL — ABNORMAL HIGH (ref 1.7–7.7)
Neutrophils Relative %: 80 %
Platelets: 491 10*3/uL — ABNORMAL HIGH (ref 150–400)
RBC: 4.83 MIL/uL (ref 3.87–5.11)
RDW: 12.9 % (ref 11.5–15.5)
WBC: 11.3 10*3/uL — ABNORMAL HIGH (ref 4.0–10.5)
nRBC: 0 % (ref 0.0–0.2)

## 2023-07-09 LAB — MAGNESIUM: Magnesium: 2.1 mg/dL (ref 1.7–2.4)

## 2023-07-09 MED ORDER — ADENOSINE 6 MG/2ML IV SOLN
12.0000 mg | Freq: Once | INTRAVENOUS | Status: AC
  Administered 2023-07-09: 12 mg via INTRAVENOUS

## 2023-07-09 MED ORDER — POTASSIUM CHLORIDE CRYS ER 20 MEQ PO TBCR
40.0000 meq | EXTENDED_RELEASE_TABLET | Freq: Once | ORAL | Status: AC
  Administered 2023-07-09: 40 meq via ORAL
  Filled 2023-07-09: qty 2

## 2023-07-09 MED ORDER — ADENOSINE 6 MG/2ML IV SOLN
6.0000 mg | Freq: Once | INTRAVENOUS | Status: AC
  Administered 2023-07-09: 6 mg via INTRAVENOUS
  Filled 2023-07-09: qty 2

## 2023-07-09 NOTE — ED Notes (Signed)
6mg  of adenosine given at 1639 with a HR of 175. 60 seconds passed with no decrease in HR. 12mg  of adenosine given at 1643. 15 seconds passed and a notable decrease in HR was observed. Pt's chest pain immediately improved.

## 2023-07-09 NOTE — ED Triage Notes (Signed)
Pt sent from PCP for SVT

## 2023-07-09 NOTE — ED Provider Notes (Signed)
[place] EMERGENCY DEPARTMENT AT [facility] Provider Note   CSN: [id number] Arrival date & time: 07/09/23  1600     History  Chief Complaint  Patient presents with   Tachycardia    Christina Clay is a 72 y.o. female.  She has a remote history of blood clot, has a history of hypertension hypercholesterolemia.  Complaining of increased heart rate shortness of breath and chest pressure for the last 2 hours.  No prior history of same.  The history is provided by the patient.  Chest Pain Pain location:  Substernal area Pain quality: pressure   Pain severity:  Moderate Onset quality:  Sudden Duration:  2 hours Timing:  Constant Progression:  Unchanged Chronicity:  New Associated symptoms: palpitations and shortness of breath   Associated symptoms: no abdominal pain, no cough and no fever   Risk factors: high cholesterol, hypertension and prior DVT/PE   Risk factors: no smoking        Home Medications Prior to Admission medications   Medication Sig Start Date End Date Taking? Authorizing Provider  acetaminophen (TYLENOL) 500 MG tablet Take 500-1,000 mg by mouth every 6 (six) hours as needed (for pain.).    [provider]  amLODipine (NORVASC) 5 MG tablet Take 5 mg by mouth 2 (two) times daily. 01/02/20   [provider]  amoxicillin (AMOXIL) 500 MG capsule Take 2,000 mg by mouth See admin instructions. Take 4 capsules (2000 mg) by mouth 1 hour prior to dental appointments. 10/05/17   [provider]  Eszopiclone 3 MG TABS Take 3 mg by mouth at bedtime. Take immediately before bedtime     [provider]  hydrochlorothiazide (HYDRODIURIL) 25 MG tablet Take 12.5 mg by mouth daily. 01/15/20   [provider]  HYDROcodone-acetaminophen (NORCO/VICODIN) 5-325 MG tablet Take 1-2 tablets by mouth every 6 (six) hours as needed for severe pain. 02/23/20   Edmisten, Kristie L, PA  losartan (COZAAR) 100 MG tablet Take 100 mg by mouth  daily. 01/17/20   [provider]  methocarbamol (ROBAXIN) 500 MG tablet Take 1 tablet (500 mg total) by mouth every 6 (six) hours as needed for muscle spasms. 02/23/20   Edmisten, Kristie L, PA  pravastatin (PRAVACHOL) 40 MG tablet Take 20 mg by mouth daily.    [provider]  rivaroxaban (XARELTO) 10 MG TABS tablet Take 1 tablet (10 mg total) by mouth daily with breakfast for 20 days. Then resume one 81 mg aspirin once a day. 02/23/20 03/14/20  Edmisten, Lyn Hollingshead, PA  traMADol (ULTRAM) 50 MG tablet Take 1-2 tablets (50-100 mg total) by mouth every 6 (six) hours as needed for moderate pain. 02/23/20   Edmisten, Kristie L, PA      Allergies    Naproxen sodium    Review of Systems   Review of Systems  Constitutional:  Negative for fever.  Respiratory:  Positive for shortness of breath. Negative for cough.   Cardiovascular:  Positive for chest pain and palpitations.  Gastrointestinal:  Negative for abdominal pain.    Physical Exam Updated Vital Signs BP (!) 123/44 (BP Location: Right Arm)   Pulse (!) 176   Temp 98.2 F (36.8 C) (Oral)   Resp 16   Ht 5\' 4"  (1.626 m)   Wt 82.1 kg   SpO2 93%   BMI 31.07 kg/m  Physical Exam Vitals and nursing note reviewed.  Constitutional:      General: She is not in acute distress.  Appearance: Normal appearance. She is well-developed.  HENT:     Head: Normocephalic and atraumatic.  Eyes:     Conjunctiva/sclera: Conjunctivae normal.  Cardiovascular:     Rate and Rhythm: Regular rhythm. Tachycardia present.     Heart sounds: No murmur heard. Pulmonary:     Effort: Pulmonary effort is normal. No respiratory distress.     Breath sounds: Normal breath sounds.  Abdominal:     Palpations: Abdomen is soft.     Tenderness: There is no abdominal tenderness. There is no guarding or rebound.  Musculoskeletal:        General: No swelling.     Cervical back: Neck supple.  Skin:    General: Skin is warm and dry.     Capillary Refill:  Capillary refill takes less than 2 seconds.  Neurological:     General: No focal deficit present.     Mental Status: She is alert.     ED Results / Procedures / Treatments   Labs (all labs ordered are listed, but only abnormal results are displayed) Labs Reviewed  BASIC METABOLIC PANEL - Abnormal; Notable for the following components:      Result Value   Potassium 2.9 (*)    Glucose, Bld 168 (*)    All other components within normal limits  CBC WITH DIFFERENTIAL/PLATELET - Abnormal; Notable for the following components:   WBC 11.3 (*)    Platelets 491 (*)    Neutro Abs 9.0 (*)    All other components within normal limits  MAGNESIUM    EKG EKG Interpretation Date/Time:  Thursday July 09 2023 16:07:50 EST Ventricular Rate:  178 PR Interval:    QRS Duration:  76 QT Interval:  252 QTC Calculation: 433 R Axis:   57  Text Interpretation: Supraventricular tachycardia Marked ST abnormality, possible inferior subendocardial injury Abnormal ECG When compared with ECG of 14-Feb-2020 08:44, Vent. rate has increased BY 102 BPM ST now depressed in Inferior leads Confirmed by Meridee Score [phone]) on 07/09/2023 4:09:12 PM  Radiology No results found.  Procedures .Critical Care  Performed by: Terrilee Files, MD Authorized by: Terrilee Files, MD   Critical care provider statement:    Critical care time (minutes):  30   Critical care was necessary to treat or prevent imminent or life-threatening deterioration of the following conditions:  Cardiac failure   Critical care was time spent personally by me on the following activities:  Development of treatment plan with patient or surrogate, discussions with consultants, evaluation of patient's response to treatment, examination of patient, ordering and review of laboratory studies, ordering and review of radiographic studies, ordering and performing treatments and interventions, pulse oximetry, re-evaluation of patient's  condition and review of old charts     Medications Ordered in ED Medications  adenosine (ADENOCARD) 6 MG/2ML injection 6 mg (6 mg Intravenous Given 07/09/23 1639)  adenosine (ADENOCARD) 6 MG/2ML injection 12 mg (12 mg Intravenous Given 07/09/23 1643)  potassium chloride SA (KLOR-CON M) CR tablet 40 mEq (40 mEq Oral Given 07/09/23 1745)    ED Course/ Medical Decision Making/ A&P Clinical Course as of 07/10/23 1044  Thu Jul 09, 2023  1647 Patient received 6 of adenosine with minimal slowing.  12 of adenosine broke into sinus tachycardia after some ectopy.  Will continue to monitor. [MB]  1745 Patient now sinus tach at 95.  She feels back to baseline.  I reviewed the results of her workup with her.  Recommended increase potassium intake.  Does sound like she is under a lot of stress taking care of her 48 year old mother. [MB]    Clinical Course User Index [MB] Terrilee Files, MD                                 Medical Decision Making Amount and/or Complexity of Data Reviewed Labs: ordered.  Risk Prescription drug management.   This patient complains of fast heart rate and shortness of breath chest pressure; this involves an extensive number of treatment Options and is a complaint that carries with it a high risk of complications and morbidity. The differential includes ACS, arrhythmia, endocrine, PE, infection I ordered, reviewed and interpreted labs, which included CBC with slightly elevated white count, chemistries with low potassium elevated glucose  I ordered medication IV adenosine 6 and then 12 mg, oral potassium and reviewed PMP when indicated.  Additional history obtained from patient's husband Previous records obtained and reviewed in epic no similar presentations Cardiac monitoring reviewed, patient in SVT Social determinants considered, no significant barriers Critical Interventions: Patient was given adenosine 6 and then 12 mg after being placed on cardiac monitor  and pacer pads.  After 12 mg she broke back into sinus tachycardia and slowly return to normal sinus rhythm.  Patient tolerated procedure well.  She was observed for period of time and had no further arrhythmias.  After the interventions stated above, I reevaluated the patient and found patient feeling back to baseline awake alert Admission and further testing considered, recommended close follow-up with her PCP and consideration for or seeing cardiology.  She does endorse she has been under a lot of stress.  Return instructions discussed.         Final Clinical Impression(s) / ED Diagnoses Final diagnoses:  SVT (supraventricular tachycardia) (HCC)  Hypokalemia    Rx / DC Orders ED Discharge Orders     None         Terrilee Files, MD 07/10/23 1047

## 2023-10-09 ENCOUNTER — Emergency Department (HOSPITAL_COMMUNITY)
Admission: EM | Admit: 2023-10-09 | Discharge: 2023-10-09 | Disposition: A | Discharge status: Home / Self Care | Attending: Emergency Medicine | Admitting: Emergency Medicine

## 2023-10-09 ENCOUNTER — Encounter (HOSPITAL_COMMUNITY): Payer: Self-pay | Admitting: Emergency Medicine

## 2023-10-09 ENCOUNTER — Other Ambulatory Visit: Payer: Self-pay

## 2023-10-09 ENCOUNTER — Emergency Department (HOSPITAL_COMMUNITY)

## 2023-10-09 DIAGNOSIS — Z79899 Other long term (current) drug therapy: Secondary | ICD-10-CM | POA: Diagnosis not present

## 2023-10-09 DIAGNOSIS — L03115 Cellulitis of right lower limb: Secondary | ICD-10-CM | POA: Insufficient documentation

## 2023-10-09 DIAGNOSIS — I10 Essential (primary) hypertension: Secondary | ICD-10-CM | POA: Diagnosis not present

## 2023-10-09 DIAGNOSIS — M7989 Other specified soft tissue disorders: Secondary | ICD-10-CM | POA: Diagnosis present

## 2023-10-09 LAB — BASIC METABOLIC PANEL WITH GFR
Anion gap: 9 (ref 5–15)
BUN: 15 mg/dL (ref 8–23)
CO2: 27 mmol/L (ref 22–32)
Calcium: 9.1 mg/dL (ref 8.9–10.3)
Chloride: 103 mmol/L (ref 98–111)
Creatinine, Ser: 0.65 mg/dL (ref 0.44–1.00)
GFR, Estimated: >60 mL/min (ref >60)
Glucose, Bld: 110 mg/dL — ABNORMAL HIGH (ref 70–99)
Potassium: 3.6 mmol/L (ref 3.5–5.1)
Sodium: 139 mmol/L (ref 135–145)

## 2023-10-09 LAB — CBC WITH DIFFERENTIAL/PLATELET
Abs Immature Granulocytes: 0.23 10*3/uL — ABNORMAL HIGH (ref 0.00–0.07)
Basophils Absolute: 0.1 10*3/uL (ref 0.0–0.1)
Basophils Relative: 1 %
Eosinophils Absolute: 0 10*3/uL (ref 0.0–0.5)
Eosinophils Relative: 0 %
HCT: 38 % (ref 36.0–46.0)
Hemoglobin: 12.1 g/dL (ref 12.0–15.0)
Immature Granulocytes: 2 %
Lymphocytes Relative: 15 %
Lymphs Abs: 1.6 10*3/uL (ref 0.7–4.0)
MCH: 28.1 pg (ref 26.0–34.0)
MCHC: 31.8 g/dL (ref 30.0–36.0)
MCV: 88.2 fL (ref 80.0–100.0)
Monocytes Absolute: 0.5 10*3/uL (ref 0.1–1.0)
Monocytes Relative: 5 %
Neutro Abs: 8.3 10*3/uL — ABNORMAL HIGH (ref 1.7–7.7)
Neutrophils Relative %: 77 %
Platelets: 478 10*3/uL — ABNORMAL HIGH (ref 150–400)
RBC: 4.31 MIL/uL (ref 3.87–5.11)
RDW: 13.5 % (ref 11.5–15.5)
WBC: 10.7 10*3/uL — ABNORMAL HIGH (ref 4.0–10.5)
nRBC: 0 % (ref 0.0–0.2)

## 2023-10-09 MED ORDER — DEXTROSE 5 % IV SOLN
1500.0000 mg | Freq: Once | INTRAVENOUS | Status: AC
  Administered 2023-10-09: 1500 mg via INTRAVENOUS
  Filled 2023-10-09: qty 75

## 2023-10-09 MED ORDER — IOHEXOL 300 MG/ML  SOLN
75.0000 mL | Freq: Once | INTRAMUSCULAR | Status: AC | PRN
  Administered 2023-10-09: 75 mL via INTRAVENOUS

## 2023-10-09 NOTE — ED Triage Notes (Signed)
 Pt bib pov w/ c/o what sounds to be a skin infection on the upper right thigh into the groin. Pt reports it is hot, red, hard, swollen, leaking fluid and pus, and very painful. Pt reports this started last Wednesday. Pt had a spinal injection on Monday 2 days prior. Pt went to her PCP and was given an abx. The first abx made her sick. She got a new medication. It does not make her sick, but it is not improving the site.

## 2023-10-09 NOTE — Consult Note (Signed)
 Pharmacy Note: dalbavancin (DALVANCE) for Acute Bacterial Skin and Skin Structure Infection (ABSSSI) Patients to [facility] Discharge   Christina Clay is a 73 y.o. female who presented to [facility] on 10/09/2023 with an ABSSSI.  Inclusion criteria:  Indication: Cellulitis  Patient has at least one SIRS criteria present: HR > 90   Exclusion criteria: Patient was evaluated and negative for hardware involvement, hypotension / shock, elevated lactate > 2 without other explanation, gram-negative infection risk factors (bites, water exposure, infection after trauma, infection after skin graft, burns, severe immunocompromise), necrotizing fasciitis possible / confirmed, known or suspected osteomyelitis or septic arthritis, endocarditis, diabetic foot infection, ischemic ulcers, post-operative wound infection, perirectal infection, need for drainage in the OR, hand / facial infections, injection drug users with  fever, bacteremia, pregnancy or breastfeeding, allergy related to antibiotics like vancomycin, known liver disease ( T.Bili > 2x ULN or AST/ALT > 3x ULN).  Barrie Folk 10/09/2023, 6:31 PM Clinical Pharmacist

## 2023-10-09 NOTE — Discharge Instructions (Signed)
 You are seen in the ER for an action of your right thigh.  Your CT scan shows cellulitis with no fluid collections.  You are given IV antibiotic called Dalvance which is a single dose treatment for skin infections.  Stop the doxycycline you are taking.  Follow-up on Monday for recheck with your PCP, if you have fever increased pain or redness, increased swelling or any other new or worsening symptoms come back to the ER right away.

## 2023-10-09 NOTE — ED Provider Notes (Signed)
 [name] EMERGENCY DEPARTMENT AT [facility] Provider Note   CSN: [id number] Arrival date & time: 10/09/23  1334     History  Chief Complaint  Patient presents with   leg infection    Christina Clay is a 73 y.o. female.  She has PMH of high blood pressure and high cholesterol.  Presents the ER complaining of infection to the right thigh.  Is ongoing for 9 days, spinal injection 2 days previous to that as it is uncomplicated, no back pain regards to her back but developed a draining area to the right thigh.  Was put on doxycycline by her PCP and she states it has not gotten better, continues to ooze intermittently.  She denies fever or chills, numbness or tingling, denies any injury to that area.  HPI     Home Medications Prior to Admission medications   Medication Sig Start Date End Date Taking? Authorizing Provider  acetaminophen (TYLENOL) 500 MG tablet Take 500-1,000 mg by mouth every 6 (six) hours as needed (for pain.).    [provider]  amLODipine (NORVASC) 5 MG tablet Take 5 mg by mouth 2 (two) times daily. 01/02/20   [provider]  amoxicillin (AMOXIL) 500 MG capsule Take 2,000 mg by mouth See admin instructions. Take 4 capsules (2000 mg) by mouth 1 hour prior to dental appointments. 10/05/17   [provider]  Eszopiclone 3 MG TABS Take 3 mg by mouth at bedtime. Take immediately before bedtime     [provider]  hydrochlorothiazide (HYDRODIURIL) 25 MG tablet Take 12.5 mg by mouth daily. 01/15/20   [provider]  HYDROcodone-acetaminophen (NORCO/VICODIN) 5-325 MG tablet Take 1-2 tablets by mouth every 6 (six) hours as needed for severe pain. 02/23/20   Edmisten, Kristie L, PA  losartan (COZAAR) 100 MG tablet Take 100 mg by mouth daily. 01/17/20   [provider]  methocarbamol (ROBAXIN) 500 MG tablet Take 1 tablet (500 mg total) by mouth every 6 (six) hours as needed for muscle spasms. 02/23/20   Edmisten,  Kristie L, PA  pravastatin (PRAVACHOL) 40 MG tablet Take 20 mg by mouth daily.    [provider]  rivaroxaban (XARELTO) 10 MG TABS tablet Take 1 tablet (10 mg total) by mouth daily with breakfast for 20 days. Then resume one 81 mg aspirin once a day. 02/23/20 03/14/20  Edmisten, Lyn Hollingshead, PA  traMADol (ULTRAM) 50 MG tablet Take 1-2 tablets (50-100 mg total) by mouth every 6 (six) hours as needed for moderate pain. 02/23/20   Edmisten, Lyn Hollingshead, PA      Allergies    Naproxen sodium and Sulfa antibiotics    Review of Systems   Review of Systems  Physical Exam Updated Vital Signs BP (!) 159/77   Pulse (!) 101   Temp 98.4 F (36.9 C) (Oral)   Resp 15   Ht 5\' 4"  (1.626 m)   Wt 81.6 kg   SpO2 97%   BMI 30.90 kg/m  Physical Exam Vitals and nursing note reviewed.  Constitutional:      General: She is not in acute distress.    Appearance: She is well-developed.  HENT:     Head: Normocephalic and atraumatic.  Eyes:     Conjunctiva/sclera: Conjunctivae normal.  Cardiovascular:     Rate and Rhythm: Normal rate and regular rhythm.     Heart sounds: No murmur heard. Pulmonary:     Effort: Pulmonary effort is normal. No respiratory distress.  Breath sounds: Normal breath sounds.  Abdominal:     Palpations: Abdomen is soft.     Tenderness: There is no abdominal tenderness.  Musculoskeletal:        General: No swelling.     Cervical back: Neck supple.  Skin:    General: Skin is warm and dry.     Capillary Refill: Capillary refill takes less than 2 seconds.  Neurological:     Mental Status: She is alert.  Psychiatric:        Mood and Affect: Mood normal.     ED Results / Procedures / Treatments   Labs (all labs ordered are listed, but only abnormal results are displayed) Labs Reviewed - No data to display  EKG None  Radiology No results found.  Procedures Procedures    Medications Ordered in ED Medications - No data to display  ED Course/ Medical  Decision Making/ A&P                                 Medical Decision Making This patient presents to the ED for concern of infection to right thigh, this involves an extensive number of treatment options, and is a complaint that carries with it a high risk of complications and morbidity.  The differential diagnosis includes abscess, cellulitis, myositis, sepsis, other    Co morbidities that complicate the patient evaluation  Pretension, high cholesterol   Additional history obtained:  Additional history obtained from EMR External records from outside source obtained and reviewed including prior notes and labs   Lab Tests:  I Ordered, and personally interpreted labs.  The pertinent results include: CBC reveals white blood cell count of 10.7, platelet slightly increased at 478, BMP is normal   Imaging Studies ordered:  I ordered imaging studies including CT right femur with contrast I independently visualized and interpreted imaging which showed lightest without fluid collection, no soft tissue gas I agree with the radiologist interpretation     Problem List / ED Course / Critical interventions / Medication management  Right thigh infection-patient on doxycycline stating the redness is not improving, she does have some drainage at home no active drainage on exam, there is no fluctuance but some mild induration.  CT ordered to rule out deep space infection or soft tissue emphysema.  CT showed likely cellulitis with some soft tissue stranding but otherwise reassuring CT.  Patient is afebrile, was slightly tachycardic initially but this improved without invention.  Patient does meet criteria for Dalvance.  Discussed with patient we could do Dalvance versus IV antibiotics for outpatient failure of the doxycycline and she preferred Dalvance.  She is going to follow-up closely with her PCP.  Advised on strict return precautions.  I have reviewed the patients home medicines and have made  adjustments as needed    Amount and/or Complexity of Data Reviewed Labs: ordered. Radiology: ordered.  Risk Prescription drug management.           Final Clinical Impression(s) / ED Diagnoses Final diagnoses:  None    Rx / DC Orders ED Discharge Orders     None         Josem Kaufmann 10/09/23 2149    Bethann Berkshire, MD 10/11/23 1233

## 2024-01-20 ENCOUNTER — Ambulatory Visit: Payer: Self-pay | Admitting: Podiatry

## 2024-01-20 DIAGNOSIS — M79671 Pain in right foot: Secondary | ICD-10-CM | POA: Diagnosis not present

## 2024-01-20 DIAGNOSIS — M79672 Pain in left foot: Secondary | ICD-10-CM | POA: Diagnosis not present

## 2024-01-20 DIAGNOSIS — M7752 Other enthesopathy of left foot: Secondary | ICD-10-CM | POA: Diagnosis not present

## 2024-01-20 MED ORDER — TRIAMCINOLONE ACETONIDE 10 MG/ML IJ SUSP
10.0000 mg | Freq: Once | INTRAMUSCULAR | Status: AC
Start: 2024-01-20 — End: 2024-01-20
  Administered 2024-01-20: 10 mg

## 2024-01-20 NOTE — Progress Notes (Signed)
 Patient presents for complaint of plantar lateral aspect foot on the left.  Has been having increasing pain there for a while.  Develops a thick skin lesion.  Has lesions elsewhere on the foot on the toes and around the fifth metatarsal phalangeal joints give her pain with walking and wearing shoes.  She typically wears Birkenstocks or Dansko.   Physical exam:  General appearance: Pleasant, and in no acute distress. AOx3.  Vascular: Pedal pulses: DP 2/4 bilaterally, PT 2/4 bilaterally.  Mild edema lower legs bilaterally. Capillary fill time immediate bilateral.  Neurological: Light touch intact feet bilaterally.  Normal Achilles reflex bilaterally.  No clonus or spasticity noted.   Dermatologic:   Skin normal temperature bilaterally.  Skin normal color, tone, and texture bilaterally.  Multiple hyperkeratotic lesions distal aspect toes at the fifth MTPs bilaterally and at the great toes bilaterally.  Musculoskeletal: With bunion deformities bilaterally.  Hammertoe deformities 2 through 5 bilaterally hallux abductovalgus bilaterally.  Tenderness on the plantar lateral aspect of the foot at the base of the fifth metatarsal.   Diagnosis: 1.  Pain feet bilaterally 2 bursitis plantar lateral foot left  Plan: -New patient office visit level 3 for evaluation management.  Modifier 25 -Discussed with her the inflamed bursa on the lateral aspect of the foot.  Will try an injection for this discussed the hyperkeratotic lesions on the distal aspect of the digits and at the tailor's bunion deformities bilaterally.  Discussed wearing shoes with good padding and support.  Use pumice stone or similar abrasive on them to keep them from coming back as quickly.  Discussed proper socks -Dispensed OTC cross step orthotics  -injected 2cc mixture of 1cc 0.5% Marcaine , 1cc Kenolog 10mg /47ml,  at inflamed bursa plantar lateral left foot.    Return as needed

## 2024-07-10 ENCOUNTER — Emergency Department (HOSPITAL_COMMUNITY)

## 2024-07-10 ENCOUNTER — Other Ambulatory Visit: Payer: Self-pay

## 2024-07-10 ENCOUNTER — Emergency Department (HOSPITAL_COMMUNITY)
Admission: EM | Admit: 2024-07-10 | Discharge: 2024-07-10 | Disposition: A | Discharge status: Home / Self Care | Attending: Emergency Medicine | Admitting: Emergency Medicine

## 2024-07-10 DIAGNOSIS — R509 Fever, unspecified: Secondary | ICD-10-CM | POA: Diagnosis present

## 2024-07-10 DIAGNOSIS — I471 Supraventricular tachycardia, unspecified: Secondary | ICD-10-CM | POA: Diagnosis not present

## 2024-07-10 DIAGNOSIS — J101 Influenza due to other identified influenza virus with other respiratory manifestations: Secondary | ICD-10-CM | POA: Diagnosis not present

## 2024-07-10 DIAGNOSIS — R059 Cough, unspecified: Secondary | ICD-10-CM | POA: Diagnosis present

## 2024-07-10 DIAGNOSIS — Z79899 Other long term (current) drug therapy: Secondary | ICD-10-CM | POA: Insufficient documentation

## 2024-07-10 DIAGNOSIS — R002 Palpitations: Secondary | ICD-10-CM | POA: Diagnosis present

## 2024-07-10 LAB — BASIC METABOLIC PANEL WITH GFR
Anion gap: 19 — ABNORMAL HIGH (ref 5–15)
BUN: 15 mg/dL (ref 8–23)
CO2: 19 mmol/L — ABNORMAL LOW (ref 22–32)
Calcium: 8.9 mg/dL (ref 8.9–10.3)
Chloride: 100 mmol/L (ref 98–111)
Creatinine, Ser: 0.56 mg/dL (ref 0.44–1.00)
GFR, Estimated: >60 mL/min
Glucose, Bld: 155 mg/dL — ABNORMAL HIGH (ref 70–99)
Potassium: 3.3 mmol/L — ABNORMAL LOW (ref 3.5–5.1)
Sodium: 138 mmol/L (ref 135–145)

## 2024-07-10 LAB — MAGNESIUM: Magnesium: 2.4 mg/dL (ref 1.7–2.4)

## 2024-07-10 LAB — CBC
HCT: 42.8 % (ref 36.0–46.0)
Hemoglobin: 14 g/dL (ref 12.0–15.0)
MCH: 28.5 pg (ref 26.0–34.0)
MCHC: 32.7 g/dL (ref 30.0–36.0)
MCV: 87.2 fL (ref 80.0–100.0)
Platelets: 329 K/uL (ref 150–400)
RBC: 4.91 MIL/uL (ref 3.87–5.11)
RDW: 13.2 % (ref 11.5–15.5)
WBC: 7.5 K/uL (ref 4.0–10.5)
nRBC: 0 % (ref 0.0–0.2)

## 2024-07-10 LAB — RESP PANEL BY RT-PCR (RSV, FLU A&B, COVID)  RVPGX2
Influenza A by PCR: POSITIVE — AB
Influenza B by PCR: NEGATIVE
Resp Syncytial Virus by PCR: NEGATIVE
SARS Coronavirus 2 by RT PCR: NEGATIVE

## 2024-07-10 MED ORDER — ADENOSINE 6 MG/2ML IV SOLN
INTRAVENOUS | Status: AC
  Filled 2024-07-10: qty 4

## 2024-07-10 MED ORDER — ADENOSINE 6 MG/2ML IV SOLN: 6.0000 mg | Freq: Once | INTRAVENOUS | Status: AC

## 2024-07-10 MED ORDER — OSELTAMIVIR PHOSPHATE 75 MG PO CAPS: 75.0000 mg | ORAL_CAPSULE | Freq: Two times a day (BID) | ORAL | 0 refills | Status: AC

## 2024-07-10 MED ORDER — POTASSIUM CHLORIDE CRYS ER 20 MEQ PO TBCR
40.0000 meq | EXTENDED_RELEASE_TABLET | Freq: Once | ORAL | Status: AC
  Administered 2024-07-10: 40 meq via ORAL
  Filled 2024-07-10: qty 2

## 2024-07-10 MED ORDER — POTASSIUM CHLORIDE CRYS ER 20 MEQ PO TBCR: 20.0000 meq | EXTENDED_RELEASE_TABLET | Freq: Every day | ORAL | 0 refills | Status: AC

## 2024-07-10 MED ORDER — ADENOSINE 6 MG/2ML IV SOLN
INTRAVENOUS | Status: AC
  Administered 2024-07-10: 6 mg via INTRAVENOUS
  Filled 2024-07-10: qty 2

## 2024-07-10 MED ORDER — ACETAMINOPHEN 325 MG PO TABS
650.0000 mg | ORAL_TABLET | Freq: Once | ORAL | Status: AC
  Administered 2024-07-10: 650 mg via ORAL
  Filled 2024-07-10: qty 2

## 2024-07-10 MED ORDER — SODIUM CHLORIDE 0.9 % IV BOLUS
1000.0000 mL | Freq: Once | INTRAVENOUS | Status: AC
  Administered 2024-07-10: 1000 mL via INTRAVENOUS

## 2024-07-10 NOTE — ED Triage Notes (Signed)
 Pt HR 178

## 2024-07-10 NOTE — ED Notes (Signed)
 EDP at bedside during triage

## 2024-07-10 NOTE — ED Provider Notes (Signed)
 " [place] EMERGENCY DEPARTMENT AT [facility] Provider Note   CSN: [id number] Arrival date & time: 07/10/24  1000     Patient presents with: svt   Christina Clay is a 73 y.o. female.   73 year old female history of SVT who presents to the emergency department with palpitations.  Hour prior to arrival start experiencing palpitations.  Says she has been sick with a flulike illness.  Took a COVID and flu at home that was negative several days ago.  Daughter tested positive for influenza.  Has been having a cough and sore throat.  1 cup of coffee this morning.  No heavy alcohol use.       Prior to Admission medications  Medication Sig Start Date End Date Taking? Authorizing Provider  oseltamivir  (TAMIFLU ) 75 MG capsule Take 1 capsule (75 mg total) by mouth every 12 (twelve) hours. 07/10/24  Yes Yolande Lamar BROCKS, MD  potassium chloride  SA (KLOR-CON  M) 20 MEQ tablet Take 1 tablet (20 mEq total) by mouth daily. 07/10/24  Yes Yolande Lamar BROCKS, MD  acetaminophen  (TYLENOL ) 500 MG tablet Take 500-1,000 mg by mouth every 6 (six) hours as needed (for pain.).    [provider]  amLODipine  (NORVASC ) 5 MG tablet Take 5 mg by mouth 2 (two) times daily. 01/02/20   [provider]  amoxicillin (AMOXIL) 500 MG capsule Take 2,000 mg by mouth See admin instructions. Take 4 capsules (2000 mg) by mouth 1 hour prior to dental appointments. 10/05/17   [provider]  Eszopiclone 3 MG TABS Take 3 mg by mouth at bedtime. Take immediately before bedtime     [provider]  hydrochlorothiazide  (HYDRODIURIL ) 25 MG tablet Take 12.5 mg by mouth daily. 01/15/20   [provider]  HYDROcodone -acetaminophen  (NORCO/VICODIN) 5-325 MG tablet Take 1-2 tablets by mouth every 6 (six) hours as needed for severe pain. 02/23/20   Edmisten, Kristie L, PA  losartan  (COZAAR ) 100 MG tablet Take 100 mg by mouth daily. 01/17/20   [provider]  methocarbamol   (ROBAXIN ) 500 MG tablet Take 1 tablet (500 mg total) by mouth every 6 (six) hours as needed for muscle spasms. 02/23/20   Edmisten, Kristie L, PA  pravastatin  (PRAVACHOL ) 40 MG tablet Take 20 mg by mouth daily.    [provider]  rivaroxaban  (XARELTO ) 10 MG TABS tablet Take 1 tablet (10 mg total) by mouth daily with breakfast for 20 days. Then resume one 81 mg aspirin once a day. 02/23/20 01/20/24  Edmisten, Kristie L, PA  traMADol  (ULTRAM ) 50 MG tablet Take 1-2 tablets (50-100 mg total) by mouth every 6 (six) hours as needed for moderate pain. 02/23/20   Edmisten, Roxie CROME, PA    Allergies: Naproxen sodium and Sulfa antibiotics    Review of Systems  Updated Vital Signs BP 102/60   Pulse 99   Temp (!) 100.6 F (38.1 C) (Oral)   Resp (!) 31   Ht 5' 7 (1.702 m)   Wt 79.4 kg   SpO2 94%   BMI 27.41 kg/m   Physical Exam Vitals and nursing note reviewed.  Constitutional:      General: She is not in acute distress.    Appearance: She is well-developed.  HENT:     Head: Normocephalic and atraumatic.     Right Ear: External ear normal.     Left Ear: External ear normal.     Nose: Congestion present.     Mouth/Throat:     Mouth:  Mucous membranes are moist.     Pharynx: Oropharynx is clear.  Eyes:     Extraocular Movements: Extraocular movements intact.     Conjunctiva/sclera: Conjunctivae normal.     Pupils: Pupils are equal, round, and reactive to light.  Cardiovascular:     Rate and Rhythm: Regular rhythm. Tachycardia present.     Heart sounds: No murmur heard. Pulmonary:     Effort: Pulmonary effort is normal. No respiratory distress.     Breath sounds: Normal breath sounds.  Musculoskeletal:     Cervical back: Normal range of motion and neck supple.     Right lower leg: No edema.     Left lower leg: No edema.  Skin:    General: Skin is warm and dry.  Neurological:     Mental Status: She is alert and oriented to person, place, and time. Mental status is at  baseline.  Psychiatric:        Mood and Affect: Mood normal.     (all labs ordered are listed, but only abnormal results are displayed) Labs Reviewed  RESP PANEL BY RT-PCR (RSV, FLU A&B, COVID)  RVPGX2 - Abnormal; Notable for the following components:      Result Value   Influenza A by PCR POSITIVE (*)    All other components within normal limits  BASIC METABOLIC PANEL WITH GFR - Abnormal; Notable for the following components:   Potassium 3.3 (*)    CO2 19 (*)    Glucose, Bld 155 (*)    Anion gap 19 (*)    All other components within normal limits  MAGNESIUM   CBC    EKG: EKG Interpretation Date/Time:  Sunday July 10 2024 10:29:19 EST Ventricular Rate:  105 PR Interval:  148 QRS Duration:  98 QT Interval:  344 QTC Calculation: 455 R Axis:   47  Text Interpretation: Sinus tachycardia Abnormal R-wave progression, early transition Confirmed by Yolande Charleston [phone]) on 07/10/2024 11:30:36 AM  Radiology: [facility] Chest Port 1 View Result Date: 07/10/2024 EXAM: 1 VIEW(S) XRAY OF THE CHEST 07/10/2024 10:35:39 AM COMPARISON: None available. CLINICAL HISTORY: 73 year old female with palpitations and tachycardia. FINDINGS: LINES, TUBES AND DEVICES: Cardiac monitoring leads noted. LUNGS AND PLEURA: Patchy airspace opacities at left lung base. No pulmonary edema. No convincingt pleural effusion. No pneumothorax. HEART AND MEDIASTINUM: No acute abnormality of the cardiac and mediastinal silhouettes. BONES AND SOFT TISSUES: No acute osseous abnormality. IMPRESSION: 1. Patchy left basilar airspace opacity, nonspecific but may be atelectasis. No pulmonary edema or convincing pleural effusion. Electronically signed by: Helayne Hurst MD 07/10/2024 11:35 AM EST RP Workstation: [id number]     Procedures   Medications Ordered in the ED  sodium chloride  0.9 % bolus 1,000 mL (0 mLs Intravenous Stopped 07/10/24 1201)  adenosine  (ADENOCARD ) 6 MG/2ML injection 6 mg (6 mg Intravenous Not Given  07/10/24 1032)  acetaminophen  (TYLENOL ) tablet 650 mg (650 mg Oral Given 07/10/24 1040)  potassium chloride  SA (KLOR-CON  M) CR tablet 40 mEq (40 mEq Oral Given 07/10/24 1200)    Clinical Course as of 07/10/24 2019  Sun Jul 10, 2024  1139 DG Chest Port 1 View IMPRESSION: 1. Patchy left basilar airspace opacity, nonspecific but may be atelectasis. No pulmonary edema or convincing pleural effusion.  Pt already on augmentin.  [RP]    Clinical Course User Index [RP] Yolande Charleston BROCKS, MD  Medical Decision Making Amount and/or Complexity of Data Reviewed Labs: ordered. Radiology: ordered. Decision-making details documented in ED Course.  Risk OTC drugs. Prescription drug management.   OTILIA KAREEM is a 73 year old female with a history of SVT who presents to the emergency department with palpitations  Initial Ddx:  SVT, arrhythmia, URI, pneumonia  MDM/Course:  Patient presents emergency department palpitations.  On arrival is tachycardic to 180 bpm.  EKG shows she is in SVT.  Was given 6 mg of adenosine  and converted to sinus tachycardia.  Upon re-evaluation sounds like she is been having flulike symptoms recently.  Was febrile.  Was given IV fluids and Tylenol  and heart rate improved.  Was found to have influenza A which her daughter currently has.  Chest x-ray on my read without any acute infiltrate.  Radiology is saying that there is a basilar airspace opacity that is nonspecific but may be atelectasis.  Patient is already taking Augmentin at home so we will hold off on additional antibiotics at this point in time since I suspect that her symptoms were so from influenza rather than a pneumonia.  Will have her follow-up with her primary doctor in several days as well as cardiology.  Will start her on Tamiflu  at this time  This patient presents to the ED for concern of complaints listed in HPI, this involves an extensive number of treatment options,  and is a complaint that carries with it a high risk of complications and morbidity. Disposition including potential need for admission considered.   Dispo: DC Home. Return precautions discussed including, but not limited to, those listed in the AVS. Allowed pt time to ask questions which were answered fully prior to dc.  I have reviewed the patients home medications and made adjustments as needed Additional history obtained from spouse Records reviewed Outpatient Clinic Notes The following labs were independently interpreted: Chemistry and show no acute abnormality I independently reviewed the following imaging with scope of interpretation limited to determining acute life threatening conditions related to emergency care: Chest x-ray and agree with the radiologist interpretation with the following exceptions: none I personally reviewed and interpreted cardiac monitoring: sinus tachycardia and SVT I personally reviewed and interpreted the pt's EKG: see above for interpretation  Social Determinants of health:  Geriatric  CRITICAL CARE Performed by: Lamar JAYSON Shan   Total critical care time: 30 minutes  Critical care time was exclusive of separately billable procedures and treating other patients.  Critical care was necessary to treat or prevent imminent or life-threatening deterioration.  Critical care was time spent personally by me on the following activities: development of treatment plan with patient and/or surrogate as well as nursing, discussions with consultants, evaluation of patient's response to treatment, examination of patient, obtaining history from patient or surrogate, ordering and performing treatments and interventions, ordering and review of laboratory studies, ordering and review of radiographic studies, pulse oximetry and re-evaluation of patient's condition.   Portions of this note were generated with Scientist, clinical (histocompatibility and immunogenetics). Dictation errors may occur despite best  attempts at proofreading.     Final diagnoses:  SVT (supraventricular tachycardia)  Influenza A    ED Discharge Orders          Ordered    Amb referral to [facility]  Status:  Canceled        07/10/24 1013    oseltamivir  (TAMIFLU ) 75 MG capsule  Every 12 hours        07/10/24 1144    potassium  chloride SA (KLOR-CON  M) 20 MEQ tablet  Daily        07/10/24 1144    Ambulatory referral to Cardiology        07/10/24 1144               Yolande Lamar BROCKS, MD 07/10/24 2019  "

## 2024-07-10 NOTE — Discharge Instructions (Signed)
 You were seen for your palpitations/SVT in the emergency department. You were found to have the flu.   At home, please stay well hydrated. Take the potassium pills for the next 5 days. Take the tamiflu  we prescribed you.  Complete the course of augmentin you are on.   Follow-up with your primary doctor in 2-3 days regarding your visit.  Cardiology will be calling you regarding an appointment within the next 72 hours.  You may contact them if you do not hear from them in that time using the information in this packet.  Please talk to them to see if you need an outpatient (Holter) monitor.  Return immediately to the emergency department if you experience any of the following: palpitations, chest pain, shortness of breath, fainting, or any other concerning symptoms.    Thank you for visiting our Emergency Department. It was a pleasure taking care of you today.

## 2024-07-10 NOTE — ED Notes (Signed)
 6mg  pushed at 1024 with EDP at bedside.

## 2024-08-31 ENCOUNTER — Ambulatory Visit: Admitting: Internal Medicine
